# Patient Record
Sex: Female | Born: 1960 | Race: White | Hispanic: No | Marital: Married | Smoking: Never smoker
Health system: Southern US, Community
[De-identification: ages and names within clinical notes are randomized; demographics above are authoritative.]

## PROBLEM LIST (undated history)

## (undated) DIAGNOSIS — F419 Anxiety disorder, unspecified: Secondary | ICD-10-CM

## (undated) HISTORY — PX: ABDOMINAL HYSTERECTOMY: SHX81

## (undated) HISTORY — PX: TUBAL LIGATION: SHX77

---

## 2004-11-11 ENCOUNTER — Ambulatory Visit: Payer: Self-pay | Admitting: Internal Medicine

## 2005-07-08 ENCOUNTER — Ambulatory Visit: Payer: Self-pay | Admitting: Internal Medicine

## 2005-09-08 ENCOUNTER — Other Ambulatory Visit: Admission: RE | Admit: 2005-09-08 | Discharge: 2005-09-08 | Payer: Self-pay | Admitting: Obstetrics and Gynecology

## 2005-12-11 ENCOUNTER — Emergency Department (HOSPITAL_COMMUNITY): Admission: EM | Admit: 2005-12-11 | Discharge: 2005-12-11 | Payer: Self-pay | Admitting: Emergency Medicine

## 2006-01-06 ENCOUNTER — Encounter (INDEPENDENT_AMBULATORY_CARE_PROVIDER_SITE_OTHER): Payer: Self-pay | Admitting: Specialist

## 2006-01-06 ENCOUNTER — Ambulatory Visit (HOSPITAL_COMMUNITY): Admission: RE | Admit: 2006-01-06 | Discharge: 2006-01-06 | Payer: Self-pay | Admitting: Obstetrics and Gynecology

## 2006-03-16 ENCOUNTER — Inpatient Hospital Stay (HOSPITAL_COMMUNITY): Admission: RE | Admit: 2006-03-16 | Discharge: 2006-03-17 | Payer: Self-pay | Admitting: Obstetrics and Gynecology

## 2006-03-16 ENCOUNTER — Encounter (INDEPENDENT_AMBULATORY_CARE_PROVIDER_SITE_OTHER): Payer: Self-pay | Admitting: Specialist

## 2007-02-01 ENCOUNTER — Ambulatory Visit: Payer: Self-pay | Admitting: Internal Medicine

## 2007-03-09 ENCOUNTER — Ambulatory Visit: Payer: Self-pay | Admitting: Internal Medicine

## 2007-03-09 LAB — CONVERTED CEMR LAB
CO2: 30 meq/L (ref 19–32)
Chloride: 106 meq/L (ref 96–112)
Creatinine, Ser: 0.7 mg/dL (ref 0.4–1.2)
GFR calc Af Amer: 116 mL/min
Glucose, Bld: 85 mg/dL (ref 70–99)
Potassium: 4 meq/L (ref 3.5–5.1)
Sodium: 141 meq/L (ref 135–145)
Total Bilirubin: 0.7 mg/dL (ref 0.3–1.2)
Total CHOL/HDL Ratio: 3.9
Total Protein: 7.3 g/dL (ref 6.0–8.3)
Triglycerides: 59 mg/dL (ref 0–149)
VLDL: 12 mg/dL (ref 0–40)

## 2008-11-03 ENCOUNTER — Telehealth: Payer: Self-pay | Admitting: Internal Medicine

## 2008-11-18 ENCOUNTER — Telehealth: Payer: Self-pay | Admitting: Internal Medicine

## 2009-09-18 ENCOUNTER — Ambulatory Visit: Payer: Self-pay | Admitting: Family Medicine

## 2009-09-18 DIAGNOSIS — J069 Acute upper respiratory infection, unspecified: Secondary | ICD-10-CM | POA: Insufficient documentation

## 2010-02-18 ENCOUNTER — Telehealth: Payer: Self-pay | Admitting: Internal Medicine

## 2010-03-25 ENCOUNTER — Ambulatory Visit: Payer: Self-pay | Admitting: Internal Medicine

## 2010-06-09 ENCOUNTER — Telehealth: Payer: Self-pay | Admitting: Internal Medicine

## 2010-06-10 ENCOUNTER — Ambulatory Visit: Payer: Self-pay | Admitting: Internal Medicine

## 2010-06-12 ENCOUNTER — Ambulatory Visit (HOSPITAL_COMMUNITY)
Admission: RE | Admit: 2010-06-12 | Discharge: 2010-06-12 | Payer: Self-pay | Source: Home / Self Care | Attending: Internal Medicine | Admitting: Internal Medicine

## 2010-06-15 ENCOUNTER — Encounter: Payer: Self-pay | Admitting: Internal Medicine

## 2010-06-15 LAB — CONVERTED CEMR LAB
BUN: 11 mg/dL (ref 6–23)
Direct LDL: 151.5 mg/dL
HDL: 47.8 mg/dL (ref >39.00)
Triglycerides: 85 mg/dL (ref 0.0–149.0)

## 2010-06-16 ENCOUNTER — Ambulatory Visit: Payer: Self-pay

## 2010-06-16 ENCOUNTER — Encounter: Payer: Self-pay | Admitting: Internal Medicine

## 2010-06-21 ENCOUNTER — Encounter: Payer: Self-pay | Admitting: Internal Medicine

## 2010-07-20 NOTE — Assessment & Plan Note (Signed)
Summary: SINUS & COUGH/KH   Vital Signs:  Patient Profile:   50 Years Old Female CC:      sinus pressure, sore throat, chest congestion, cough, headache x 5 days Height:     62 inches Weight:      190 pounds O2 Sat:      95 % O2 treatment:    Room Air Temp:     97.0 degrees F oral Pulse rate:   107 / minute Pulse rhythm:   regular Resp:     12 per minute BP sitting:   118 / 81  (right arm) Cuff size:   regular  Vitals Entered By: Emilio Math (September 18, 2009 10:38 AM)                  Current Allergies: ! MACRODANTINHistory of Present Illness Chief Complaint: sinus pressure, sore throat, chest congestion, cough, headache x 5 days History of Present Illness: Subjective: Patient complains of URI symptoms that started 4 days ago.  Just now develping a cough. Initial sore throat now minimal No pleuritic pain, but has soreness over sternum No wheezing + nasal congestion ? post-nasal drainage No sinus pain/pressure No itchy/red eyes No earache No hemoptysis No SOB No fever, + chills last night No nausea No vomiting No abdominal pain No diarrhea No skin rashes + fatigue + myalgias + headache Used OTC meds without relief   Current Meds PREMARIN 0.3 MG TABS (ESTROGENS CONJUGATED)  BENZONATATE 200 MG CAPS (BENZONATATE) One by mouth hs as needed cough AZITHROMYCIN 250 MG TABS (AZITHROMYCIN) Two tabs by mouth on day 1, then 1 tab daily on days 2 through 5.  Rx void after 08/27/09  REVIEW OF SYSTEMS Constitutional Symptoms      Denies fever, chills, night sweats, weight loss, weight gain, and fatigue.  Eyes       Denies change in vision, eye pain, eye discharge, glasses, contact lenses, and eye surgery. Ear/Nose/Throat/Mouth       Complains of sinus problems, sore throat, and hoarseness.      Denies hearing loss/aids, change in hearing, ear pain, ear discharge, dizziness, frequent runny nose, frequent nose bleeds, and tooth pain or bleeding.  Respiratory  Complains of productive cough and shortness of breath.      Denies dry cough, wheezing, asthma, bronchitis, and emphysema/COPD.  Cardiovascular       Denies murmurs, chest pain, and tires easily with exhertion.    Gastrointestinal       Denies stomach pain, nausea/vomiting, diarrhea, constipation, blood in bowel movements, and indigestion. Genitourniary       Denies painful urination, kidney stones, and loss of urinary control. Neurological       Denies paralysis, seizures, and fainting/blackouts. Musculoskeletal       Denies muscle pain, joint pain, joint stiffness, decreased range of motion, redness, swelling, muscle weakness, and gout.  Skin       Denies bruising, unusual mles/lumps or sores, and hair/skin or nail changes.  Psych       Denies mood changes, temper/anger issues, anxiety/stress, speech problems, depression, and sleep problems. 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  Extraocular movement is intact.  Conjunctivae are not inflamed.  Ears:  Canals normal.  Tympanic membranes normal.   Nose:  Normal septum.  Normal turbinates, mildly congested.  No sinus tenderness present.  Pharynx:  Normal  Neck:  Supple.  No adenopathy is present.  Lungs:  Clear to auscultation.  Breath sounds are equal.  Chest:  tenderness over sternum Heart:  Regular rate and rhythm without murmurs, rubs, or gallops.  Abdomen:  Nontender without masses or hepatosplenomegaly.  Bowel sounds are present.  No CVA or flank tenderness.  Assessment New Problems: URI (ICD-465.9)  SUSPECT VIRAL URI; NO EVIDENCE BACTERIAL INFECTION AT THIS TIME  Plan New  Medications/Changes: AZITHROMYCIN 250 MG TABS (AZITHROMYCIN) Two tabs by mouth on day 1, then 1 tab daily on days 2 through 5.  Rx void after 08/27/09  #6 tabs x 0, 09/18/2009, Donna Christen MD BENZONATATE 200 MG CAPS (BENZONATATE) One by mouth hs as needed cough  #12 x 0, 09/18/2009, Donna Christen MD  New Orders: New Patient Level III [phone] Planning Comments:   Treat symptomatically for now:  expectorant/decongestant, fluids, topical decongestant, cough suppressant at night. Rx for Z-pack to hold; begin taking  if develops increasing facial pressure and/or fever Follow-up with PCP if not improving    Diagnoses and expected course of recovery discussed and will return if not improved as expected or if the condition worsens. Patient and/or caregiver verbalized understanding.  Prescriptions: AZITHROMYCIN 250 MG TABS (AZITHROMYCIN) Two tabs by mouth on day 1, then 1 tab daily on days 2 through 5.  Rx void after 08/27/09  #6 tabs x 0   Entered and Authorized by:   Donna Christen MD   Signed by:   Donna Christen MD on 09/18/2009   Method used:   Print then Give to Patient   RxID:   [id number] BENZONATATE 200 MG CAPS (BENZONATATE) One by mouth hs as needed cough  #12 x 0   Entered and Authorized by:   Donna Christen MD   Signed by:   Donna Christen MD on 09/18/2009   Method used:   Print then Give to Patient   RxID:   [id number]   Patient Instructions: 1)  May use Mucinex D (guaifenesin with decongestant) twice daily for congestion (or may use Mucinex plain plus Sudafed) 2)  Increase fluid intake, rest. 3)  May use Afrin nasal spray (or generic oxymetazoline) twice daily for about 5 days.  Also recommend using saline nasal spray several times daily and/or saline nasal irrigation. 4)  If persistent fever and facial pain develop, begin azithromycin. 5)  Followup with family doctor if not improving 7 to 10 days.

## 2010-07-20 NOTE — Progress Notes (Signed)
Summary: Sore throat  Phone Note Outgoing Call Call back at Work Phone [phone] Call back at [phone] (Cell - ok detailed vm)   Call placed by: Ami Bullins CMA,  February 18, 2010 8:42 AM Call placed to: Patient Details for Reason: returning pt's call Summary of Call: Pt called with c/o sore throat and sinus congestion x 4 days. I have called pt and lmovm for her to return my call Initial call taken by: Ami Bullins CMA,  February 18, 2010 8:43 AM  Follow-up for Phone Call        Pt c/o "very" sore throat, sinus congestion & drainage since monday. She has tried tylenol and ibuprofen w/little relief. Pt has mucinex D and wants to know if this is ok or other otc suggestions. Please advise.   No fever or cough. Follow-up by: Lamar Sprinkles, CMA,  February 18, 2010 8:58 AM  Additional Follow-up for Phone Call Additional follow up Details #1::        recommend ov to evaluate persistent sore throat Additional Follow-up by: Jacques Navy MD,  February 18, 2010 3:15 PM    Additional Follow-up for Phone Call Additional follow up Details #2::    Left message on voicemail notifying patient. Follow-up by: Lucious Groves CMA,  February 18, 2010 3:45 PM

## 2010-07-20 NOTE — Assessment & Plan Note (Signed)
Summary: CONGESTION/ LOV 2008 /NWS   Vital Signs:  Patient profile:   50 year old female Height:      62 inches Weight:      202 pounds BMI:     37.08 O2 Sat:      98 % on Room air Temp:     97.2 degrees F oral Pulse rate:   83 / minute BP sitting:   122 / 84  (left arm) Cuff size:   regular  Vitals Entered By: Bill Salinas CMA (March 25, 2010 8:58 AM)  O2 Flow:  Room air CC: pt c/o sinus pressure, productive cough (productive green mucous) x 3 days. Pt states she started on mucinex yesterday/ ab   CC:  pt c/o sinus pressure and productive cough (productive green mucous) x 3 days. Pt states she started on mucinex yesterday/ ab.  History of Present Illness: [name] is a 50 y.o. caucasian female who presents with pressure like symptoms in her sinuses that have lasted for two days. She complains of green sputum in her cough and nasal discharges. She noticed onset of symptoms on the night of 04OCT2011. The following day she took mucinex which releived the symptoms temporarily. To help herself sleep at night [name] took [employer] and Benadryl which allowed her to sleep. She denies fever, night sweats, discharge from her eyes. She admits to experiencing DOE, slight pressure-like chest pain, and slight light-headedness. Before presenting to the clinic, [name] took an advil.  Current Medications (verified): 1)  Premarin 0.3 Mg Tabs (Estrogens Conjugated)  Allergies (verified): 1)  ! Macrodantin  Past History:  Past Medical History: Reviewed history from 09/18/2009 and no changes required. Unremarkable  Past Surgical History: Reviewed history from 09/18/2009 and no changes required. Caesarean section Hysterectomy Tubal ligation  Review of Systems       The patient complains of chest pain, dyspnea on exertion, and prolonged cough.  The patient denies fever, weight loss, weight gain, vision loss, syncope, abdominal pain, melena, severe indigestion/heartburn, muscle  weakness, and suspicious skin lesions.         Slight pressure-like pain Patient finds it harded to breath with her cold like symptoms Cough has lasted for three days and is productive with green sputum  Physical Exam  General:  alert, appropriate dress, normal appearance, cooperative to examination, good hygiene, and overweight-appearing.   Eyes:  vision grossly intact, pupils equal, pupils round, pupils reactive to light, no injection, and no iris abnormalities.   Nose:  no external deformity, mucosal edema, airflow obstruction, L frontal sinus tenderness, L maxillary sinus tenderness, R frontal sinus tenderness, and R maxillary sinus tenderness.  poor transillumination of the frontal and maxillary sinuses Mouth:  postnasal drip.   Lungs:  normal respiratory effort, normal breath sounds, no crackles, and no wheezes.   Heart:  normal rate and regular rhythm.   Skin:  turgor normal and color normal.   Psych:  normally interactive and good eye contact.     Impression & Recommendations:  Problem # 1:  SINUSITIS (ICD-473.9) Patient's lower turbinates and septum were inflamed and irritated. She presented with tenderness in both frontal and both maxillary sinuses, with opacification of her right maxillary sinus.   Plan-            Azithromycin 250 Mg Tabs (Azithromycin) .Marland Kitchen..Marland Kitchen Two tabs by mouth on day 1, then 1 tab daily on days 2 through 5.  Pseudoephedrine 2-3 tablets once daily            Nasal Saline Spray            Antiobiotic Ointment for irritant in nasal passage  The following medications were removed from the medication list:    Benzonatate 200 Mg Caps (Benzonatate) ..... One by mouth hs as needed cough    Azithromycin 250 Mg Tabs (Azithromycin) .Marland Kitchen..Marland Kitchen Two tabs by mouth on day 1, then 1 tab daily on days 2 through 5.  rx void after 08/27/09  Complete Medication List: 1)  Premarin 0.3 Mg Tabs (Estrogens conjugated)

## 2010-07-22 NOTE — Progress Notes (Signed)
Summary: OV TOMORROW AM   Phone Note Call from Patient   Summary of Call: Pt left vm at 4:55 today. Spoke w/pt, she was in a meeting at wk and had 10 min episode. She had trouble getting the words she wanted to say out, could not write a legible sentence and had some disburbance in the right side of her right eye. She did not try to walk and stopped trying to speak due to the fact she was in a meeting w/managers. This lasted approx 10 min. At this time - approx 1 hour after episode - she has no complaints. Vision, speech, thought etc is all normal. Advised she go to ER or call on call nurses if symptoms recurred or had new symptoms, pt agreed. Scheduled for office visit tomorrow am at 9.  Initial call taken by: Lamar Sprinkles, CMA,  June 09, 2010 6:05 PM

## 2010-07-22 NOTE — Letter (Signed)
   [facility] [address] [place], [place]  [address] Phone: [phone]      June 22, 2010   [facility] [address] [place], [place] [address]  RE:  LAB RESULTS  Dear  Ms. [facility],  The following is an interpretation of your most recent lab tests.  Please take note of any instructions provided or changes to medications that have resulted from your lab work.  Carotid doppler exam was normal.  Please come see me if you have any questions about these lab results    Sincerely Yours,    Jacques Navy MD

## 2010-07-22 NOTE — Miscellaneous (Signed)
Summary: Orders Update  Clinical Lists Changes  Orders: Added new Test order of Carotid Duplex (Carotid Duplex) - Signed 

## 2010-07-22 NOTE — Assessment & Plan Note (Signed)
Summary: episode of vision & speech trouble/SD   Vital Signs:  Patient profile:   50 year old female Height:      62 inches Weight:      200 pounds BMI:     36.71 O2 Sat:      97 % on Room air Temp:     98.3 degrees F oral Pulse rate:   73 / minute BP sitting:   120 / 88  (left arm) Cuff size:   regular  Vitals Entered By: Bill Salinas CMA (June 10, 2010 9:13 AM)  O2 Flow:  Room air CC: pt here for evaluation after she had an episode yesterday, which she explains as she was trying to talk but could not get her words out. Shortly after she developed a headache. She also has c/o trouble sleeping at night with snoring and SOB/ ab   [facility]:  Jacques Navy MD  CC:  pt here for evaluation after she had an episode yesterday and which she explains as she was trying to talk but could not get her words out. Shortly after she developed a headache. She also has c/o trouble sleeping at night with snoring and SOB/ ab.  History of Present Illness: Patient presents for evaluation of episode of change in mental status. She reports that while at a mtg yesterday PM she had a 10 minute episode when she experienced an expressive aphasia, could not write an intelligible sentence. She experienced no paralysis, no loss of vision, no focal weakness or paresthesia. She never lost consciousness. She made a full recovery in approximately 10 minutes but did feel fatigued afterward. She has exprience no further symptoms. Her risk factors for atherosclerotic vascular disease are minimal: she is overweight. She does not smoke, no HTN, no increase lipids, not diabetic.   Current Medications (verified): 1)  Premarin 0.3 Mg Tabs (Estrogens Conjugated) 2)  Fish Oil 1000 Mg Caps (Omega-3 Fatty Acids) .Marland Kitchen.. 1 Capsule Once A Day 3)  Pro-Flora Concentrate  Caps (Probiotic Product) .Marland Kitchen.. 1 Capsule Daily 4)  One-A-Day Extras Antioxidant  Caps (Multiple Vitamins-Minerals) .Marland Kitchen.. 1 Capsule Daily  Allergies  (verified): 1)  ! 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    Physical Exam  General:  Well-developed,well-nourished,in no acute distress; alert,appropriate and cooperative throughout examination Head:  normocephalic and atraumatic.   Eyes:  vision grossly intact, pupils equal, pupils round, pupils reactive to light, pupils react to accomodation, corneas and lenses clear, and no retinal abnormalitiies.   Ears:  External ear exam shows no significant lesions or deformities.  Otoscopic examination reveals clear canals, tympanic membranes are intact bilaterally without bulging, retraction, inflammation or discharge. Hearing is grossly normal bilaterally. Neck:  supple, full ROM, no masses, and no carotid bruits.   Chest Wall:  no deformities.   Lungs:  normal respiratory effort and normal breath sounds.   Heart:  normal rate, regular rhythm, and no murmur.   Pulses:  2+ radial Extremities:  No clubbing, cyanosis, edema, or deformity noted with normal full  range of motion of all joints.   Neurologic:  alert & oriented X3, cranial nerves II-XII intact, strength normal in all extremities, sensation intact to light touch, gait normal, DTRs symmetrical and normal, and finger-to-nose normal. Nl rapid alternating finger movement, no dysdiadochokinesia.  Handwriting -  excellent. Skin:  turgor normal and color normal.   Cervical Nodes:  No lymphadenopathy noted Psych:  Oriented X3, memory intact for recent and remote, normally interactive, and good eye contact.     Impression & Recommendations:  Problem # 1:  ALTERED MENTAL STATUS (ICD-780.97) Patient with transient altered mental status. She has a low risk profile. Concern is for possible TIA.  Plan - routine lab; MRI/MRA brain; carotid dopplers.           start ASA 325 mg once daily   Orders: TLB-BMP (Basic Metabolic Panel-BMET) (80048-METABOL) TLB-Lipid Panel (80061-LIPID) Cardiology Referral (Cardiology) Radiology Referral (Radiology)  Complete Medication List: 1)  Premarin 0.3 Mg Tabs (Estrogens conjugated) 2)  Fish Oil 1000 Mg Caps (Omega-3 fatty acids) .Marland Kitchen.. 1 capsule once a day 3)  Pro-flora Concentrate Caps (Probiotic product) .Marland Kitchen.. 1 capsule daily 4)  One-a-day Extras Antioxidant Caps (Multiple vitamins-minerals) .Marland Kitchen.. 1 capsule daily   Orders Added: 1)  TLB-BMP (Basic Metabolic Panel-BMET) [80048-METABOL] 2)  TLB-Lipid Panel [80061-LIPID] 3)  Cardiology Referral [Cardiology] 4)  Radiology Referral [Radiology] 5)  Est. Patient Level IV [04540]

## 2010-08-25 ENCOUNTER — Other Ambulatory Visit: Payer: Self-pay | Admitting: Obstetrics and Gynecology

## 2010-08-25 DIAGNOSIS — N6310 Unspecified lump in the right breast, unspecified quadrant: Secondary | ICD-10-CM

## 2010-08-31 ENCOUNTER — Other Ambulatory Visit: Payer: Self-pay | Admitting: Obstetrics and Gynecology

## 2010-08-31 ENCOUNTER — Ambulatory Visit
Admission: RE | Admit: 2010-08-31 | Discharge: 2010-08-31 | Disposition: A | Discharge status: Home / Self Care | Payer: 59 | Source: Ambulatory Visit | Attending: Obstetrics and Gynecology | Admitting: Obstetrics and Gynecology

## 2010-08-31 DIAGNOSIS — N6310 Unspecified lump in the right breast, unspecified quadrant: Secondary | ICD-10-CM

## 2010-11-02 NOTE — Assessment & Plan Note (Signed)
[facility]                           PRIMARY CARE OFFICE NOTE   [name], [name]                   MRN:          [id number]  DATE:03/09/2007                            DOB:          [birth year]    Ms. [name] was seen in the office February 01, 2007, and at that time was  diagnosed with probable anxiety with mild depressive overtones.  She was  started on sertraline 50 mg daily.  She returns for followup and reports  she has had good results.  She finds herself much less emotionally  labile which she finds refreshing.  She reports she has had a lifetime  history of being brought to tears easily and being very emotionally  labile.  She has had no side effects from medication.   PAST MEDICAL HISTORY/FAMILY HISTORY/SOCIAL HISTORY:  Well documented in  Dr. Raphael Gibney note of Nov 11, 2004.  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   CURRENT MEDICATIONS:  1. Metamucil daily.  2. Premarin 0.3 mg daily.  3. Sertraline 50 mg daily.  4. Milk of Magnesia p.r.n.   REVIEW OF SYSTEMS:  Negative for any constitutional, cardiovascular,  respiratory, GI, GU or musculoskeletal complaints.   PHYSICAL EXAMINATION:  Temperature was 97.9, blood pressure 105/73,  pulse 72, weight 176.  GENERAL APPEARANCE:  A heavyset Caucasian female,  looks her stated age,  in no acute distress.  HEENT EXAM:  Normocephalic, atraumatic.  EACs with dry cerumen but TMs  were visualized.  No inflammation was noted.  Oropharynx with native  dentition in good repair.  No buccal or palate lesions were noted.  Posterior pharynx was clear.  Conjunctivae and sclerae were clear.  Pupils are equal, round, and reactive to light and accommodation.  Funduscopic exam deferred to ophthalmology.  NECK:  Supple without thyromegaly.  NODES:  No  lymphadenopathy was noted in the cervical or supraclavicular  regions.  CHEST:  No CVA tenderness.  LUNGS:  Clear to auscultation and percussion.  BREAST EXAM:  Deferred to Dr. Henderson Cloud.  CARDIOVASCULAR:  2+ radial pulses, no JVD or carotid bruits.  She had a  quiet precordium with a regular rate and rhythm without murmurs, rubs,  or gallops.  ABDOMEN:  Soft, no guarding, no rebound.  No organosplenomegaly was  noted.  PELVIC EXAM:  Deferred to Dr. Henderson Cloud.  RECTAL EXAM:  Deferred to Dr. Henderson Cloud.  EXTREMITIES:  Without cyanosis, clubbing, edema, no deformities were  noted.   No laboratories were obtained for this visit.  The patient's last  laboratory was from July 08, 2005 and this revealed a normal CBC.  The chemistries were all normal, including a normal glucose of 97.  Last  lipid profile dates from June of 2006, and this revealed a cholesterol  of 157, triglycerides 73, HDL 44, and LDL was 98.  The patient was  informed that she would not need additional screening until 2011.   In summary, this is a very pleasant woman who  seems to be medically  stable.  Her anxiety disorder is responding nicely to Zoloft, and she  will continue the same.  She is current and up to date with her  gynecologist for health maintenance.  She is asked to return to see me  on a p.r.n. basis.     Laura Gess Norins, MD  Electronically Signed    MEN/MedQ  DD: 03/10/2007  DT: 03/10/2007  Job #: [id number]

## 2010-11-05 NOTE — Op Note (Signed)
NAMECLAUDELL, [name]            ACCOUNT NO.:  [id number]   MEDICAL RECORD NO.:  [id number]          PATIENT TYPE:  INP   LOCATION:  9307                          FACILITY:  [facility]   PHYSICIAN:  Carrington Clamp, M.D. DATE OF BIRTH:  1960-12-24   DATE OF PROCEDURE:  03/16/2006  DATE OF DISCHARGE:                                 OPERATIVE REPORT   PREOPERATIVE DIAGNOSIS:  Menorrhagia unresponsive to conservative treatment.   POSTOPERATIVE DIAGNOSES:  1. Menorrhagia unresponsive to conservative treatment.  2. Endometriosis and fibroids.   PROCEDURE:  Total abdominal hysterectomy with bilateral salpingo-  oophorectomy.   SURGEON:  Dr. Henderson Cloud   ASSISTANT:  Dr. Greta Doom   ANESTHESIA:  General.   SPECIMENS:  Uterus and cervix.   ESTIMATED BLOOD LOSS:  100 mL.   IV FLUIDS:  1500 mL.   URINE OUTPUT:  200 mL clear.   COMPLICATIONS:  None.   FINDINGS:  There were multiple sites of endometriosis on the uterus and the  broad ligaments.  These areas were able to be excised with the uterine  specimen.  There was also a plaque of endometriosis in the cul-de-sac on the  reflection of the posterior vagina.  This is able to be cauterized with  Bovie cautery.  Ovaries and tubes appeared normal.  The uterus was  approximately 10-12 weeks in size with multiple small centimeter to 2 cm  fibroids.  Medications were Gelfoam and Ancef.  Counts were correct x3.   TECHNIQUE:  After adequate general anesthesia was achieved, the patient  prepped, draped in usual sterile fashion in dorsal supine position.  Pfannenstiel skin incision was made with the scalpel, carried down to the  fascia with the Bovie cautery.  The fascia was incised in the midline with  the Bovie cautery and then continued transverse curvilinear manner with the  Mayo scissors.  The fascia was reflected superiorly and inferiorly from the  rectus muscles and the rectus muscles split in the midline.  A bowel free  portion of  peritoneum was entered into sharply with the Metzenbaum scissors  with good visualization of the bowel and the bladder.  The peritoneum was  then incised in a superior and inferior manner with the Metzenbaums with  good visualization of bowel, bladder, and bowel.   The bowel was packed away with wet laps and O'Connor-O'Sullivan retractor  was placed.  The uterus was grasped with a pair of Kelly clamps.  The round  ligaments were suture transfixed with a stitch of 0 Vicryl.  Each pedicle  was divided with the Bovie cautery, and the Bovie cautery was then used to  incise the vesicouterine fascia.  Sharp and blunt dissection was used to  reflect the vesicouterine fascia off the cervix, thus retracting the bladder  inferiorly.  An avascular portion in the posterior broad ligament was then  entered into bilaterally with the Bovie cautery.  Each infundibulopelvic  ligament was secured with a Heaney and then divided with the Mayo scissors  and secured with both a freehand tie and a stitch of 0 Vicryl.  The ureters  were able to be identified  coursing well out of the field of dissection.  The uterine arteries were then skeletonized, and the uterine artery was  clamped bilaterally at the level of the internal os with a pair of Heaneys.  Each pedicle was divided with the Mayo scissors and secured with a stitch of  0 Vicryl.   Alternating successive bites of the bowel of the straight Heaney clamp were  used to divide the cardinal ligament.  Each pedicle was incised with the  scalpel and then secured with a stitch of 0 Vicryl.  At the level of the  reflection of the vagina onto the cervix route, a curved Heaney was placed.  The scalpel was used to incise this area, and the vagina had been entered  into.  The angle of the cuff and the uterosacral had been incorporated on  the left-hand side with Heaney stitch of 0 Vicryl.  On the left-hand side,  the uterosacral was secured with a stitch of 0 Vicryl.   The Jorgenson  scissors were then used to amputate the uterine specimen and the cervix, and  that was handed to pathology.  The angle of the cuff on the left-hand side  was secured with a 0 Vicryl Heaney stitch.   Four figure-of-eight stitches were used to close the vaginal cuff with 0  Vicryl.  A couple additional figure-of-eight stitches of 2-0 Vicryl on SH  needle were then used to ensure hemostasis on the posterior cuff and the  anterior cuff.  The ureters were identified before and after this and  coursing out of the field of operation.  Irrigation was performed, and the  small plaque of endometriosis left in the cul-de-sac on the posterior vagina  was in between the uterosacrals.  It was then cauterized with Bovie cautery.  A piece of Gelfoam was placed over this area secondary to some small oozing.  All instruments were then withdrawn from the abdomen once hemostasis was  achieved.   A freehand tie stitch had been placed on the large vessel in the peritoneum  close to the bladder but not inside the bladder.  The peritoneum was then  closed with a running stitch of 2-0 Vicryl.  This incorporated the rectus  muscles as a separate layer.  The fascia was then closed with a running  stitch of 0 PDS.  The subcutaneous tissue was rendered hemostatic with Bovie  cautery and irrigation.  This was then closed with interrupted stitches of 2-  0 plain gut.  The skin was closed with staples.  The patient tolerated the  procedure well, was returned to the recovery room in stable condition.      Carrington Clamp, M.D.  Electronically Signed     MH/MEDQ  D:  03/16/2006  T:  03/17/2006  Job:  [id number]

## 2010-11-05 NOTE — Op Note (Signed)
[name], HOGANSON            ACCOUNT NO.:  [id number]   MEDICAL RECORD NO.:  [id number]          PATIENT TYPE:  AMB   LOCATION:  SDC                           [facility]ILITY:  [facility]   PHYSICIAN:  Carrington Clamp, M.D. DATE OF BIRTH:  22-Feb-1961   DATE OF PROCEDURE:  01/06/2006  DATE OF DISCHARGE:                                 OPERATIVE REPORT   PREOPERATIVE DIAGNOSIS:  Menorrhagia.   POSTOPERATIVE DIAGNOSIS:  Menorrhagia.   PROCEDURE:  Dilation and curettage with hysteroscopy.  The Novasure ablation  was abandoned.   SURGEON:  Dr. Henderson Cloud.   ASSISTANTS:  None.   ANESTHESIA:  LMA general.   SPECIMENS:  Uterine curetting.   ESTIMATED BLOOD LOSS:  35 mL.   IV FLUIDS:  1000 mL.   URINE OUTPUT:  Not measured.   HYSTEROSCOPY LOSS:  250 mL.   COMPLICATIONS:  A posterior fundal perforation.   FINDINGS:  It was difficult to see cavity because of excessive amount of  bleeding on entry.  I believe this bleeding was from the endometrium and not  from perforation.  There were endometrial curettings taken but there were no  polyps or fibroids seen.  The perforation appeared to be posterior fundal  tracking through the portion of the cervix.  This was stable without an  excessive amount of bleeding.  It is probable that the perforation happened  during the curettage and not during the hysteroscopy.   MEDICATIONS:  1% Xylocaine.   COUNTS:  Correct x3.   TECHNIQUE:  After adequate general anesthesia was achieved, the patient was  prepped and draped in usual sterile fashion in dorsal lithotomy position.  The bladder was emptied with a red rubber catheter and a speculum placed in  the vagina.  Single-tooth tenaculum was used to grasp the cervix and the  cervix was dilated with Shawnie Pons dilators.  The cavity measured 10 cm in length  and the cervix measured 4 cm in length by a Hegar dilator.  The hysteroscope  was then passed into the uterine cavity.  Bimanual exam had been done  just  prior to the procedure and was indicated that the uterus was anteverted but  not excessively so.  There was an excessive amount of blood in the cavity  and the hysteroscopy was difficult because of the amount of tissue and blood  that was in there making it difficult to see. It is possible the perforation  had happened prior to this point, but when the perforation was looked at  later through the scope  it was  clearly posterior and the bleeding at this  point was coming from inside the cavity itself.   The hysteroscopy was attempted a couple times with increase in the pressure  flow and was unable to identify the ostia to indicate the cavity.  The  hysteroscope was then withdrawn and a curettage was then performed.  At one  point, the curet was able to be pushed into the uterus further than what was  possible for the size of the uterus and the hysteroscope was replaced.  Hysteroscope was able to track the perforation  posteriorly but not to be  able to see into the abdominal cavity.  At this point, the deficit went from  80 mL of hysteroscopic fluid to 250 mL of hysteroscopic fluid and procedure  was then abandoned including the NovaSure procedure.  All instruments were  then withdrawn from the vagina after the cervix was injected with 1%  Xylocaine.  Bleeding was watched from the os and was scant to none.  The  patient tolerated the procedure well and was returned to recovery room in  stable condition.      Carrington Clamp, M.D.  Electronically Signed     MH/MEDQ  D:  01/06/2006  T:  01/06/2006  Job:  [id number]

## 2010-11-05 NOTE — Discharge Summary (Signed)
NAMENICHELE, SLAWSON            ACCOUNT NO.:  [id number]   MEDICAL RECORD NO.:  [id number]          PATIENT TYPE:  INP   LOCATION:  9307                          [facility]ILITY:  [facility]   PHYSICIAN:  Carrington Clamp, M.D. DATE OF BIRTH:  11-20-1960   DATE OF ADMISSION:  03/16/2006  DATE OF DISCHARGE:  03/17/2006                               DISCHARGE SUMMARY   ADMITTING DIAGNOSIS:  Menorrhagia unresponsive to D&C, unable to do  NovaSure.   DISCHARGE DIAGNOSIS:  Menorrhagia unresponsive to D&C, unable to do  NovaSure, plus endometriosis and fibroids.   PERTINENT PROCEDURES PERFORMED:  Total abdominal hysterectomy with  bilateral salpingo-oophorectomy.   PERTINENT TEST RESULTS:  Were a postoperative H&H of 11.9 and 34.8.   HISTORY AND PHYSICAL:  A 50 year old with menorrhagia unresponsive to  dilation and curettage.  I was unable to do NovaSure ablation secondary  to retroverted uterus and a perforation at the time.  The patient  strongly desired definitive therapy.  The patient had been having hot  flashes and desired bilateral salpingo-oophorectomy.   PAST MEDICAL HISTORY:  Blood pressure had previously dropped at cesarean  section but no other medical problems.   PAST SURGICAL HISTORY:  The dilation and curettage, bilateral tubal  ligation, cesarean section.   PAST OBSTETRICAL HISTORY:  Term cesarean section.   PAST GYNECOLOGICAL HISTORY:  Negative for pelvic inflammatory disease or  abnormal Pap smears.   ALLERGIES:  TO MACRODANTIN.   MEDICATIONS:  Ibuprofen.   PHYSICAL EXAMINATION:  Blood pressure 118/81.  She is well.  HEENT:  Is icteric.  NECK:  Is without thyromegaly.  LUNGS:  Clear to auscultation bilaterally.  HEART:  Regular rate and rhythm.  BREASTS:  No masses.  ABDOMEN:  Soft, nontender, nondistended.  PELVIC EXAM:  Revealed normal external genitalia, vagina and cervix.  Uterus was about 10 weeks size with a normal size, shape and mobile.  RECTUM:  Was  benign.  EXTREMITIES AND SKIN:  Were benign.   Ultrasound revealed a 10 x 5 x 5.3-cm uterus with 2-cm fibroid.  The  ovaries appeared normal except for small cysts.   ASSESSMENT:  This is a 50 year old with perimenopausal symptoms desired  for definitive therapy for menorrhagia.  She had been unresponsive to  conservative therapy.  The patient desired to undergo a total abdominal  hysterectomy with bilateral salpingo-oophorectomy.  All risks, benefits  and alternatives were discussed with the patient.  The patient was to  receive SCDs intraoperatively and preoperative antibiotics.   The patient underwent the above-named procedures on March 16, 2006  without complication.  Findings were multiple sites of endometriosis in  the broad ligaments and the cul-de-sac and on the ovaries.  The patient  was returned to recovery room in stable condition.  Postoperative day #1  in the evening, the patient eating, ambulating, voiding without  complication, and the patient desired to go home.  So, she was  discharged with the following.   MEDICATIONS:  Percocet 1 at 5 mg 1 p.o. q.4-6h. p.r.n. pain.   ACTIVITIES:  Walk with assistance.  No driving for 2 weeks.  No lifting  for 6 weeks.  No sexual activity for 6 weeks.  May shower but no bath.  May walk up steps.   The patient was to return to Dr. Henderson Cloud the following Tuesday to remove  the staples and to call for any excessive vaginal bleeding or  uncontrolled pain.      Carrington Clamp, M.D.  Electronically Signed     MH/MEDQ  D:  04/12/2006  T:  04/13/2006  Job:  [id number]

## 2010-11-30 ENCOUNTER — Other Ambulatory Visit: Payer: Self-pay | Admitting: Internal Medicine

## 2010-11-30 ENCOUNTER — Other Ambulatory Visit: Payer: Self-pay

## 2010-11-30 DIAGNOSIS — E785 Hyperlipidemia, unspecified: Secondary | ICD-10-CM

## 2011-03-25 ENCOUNTER — Encounter: Payer: Self-pay | Admitting: Family Medicine

## 2011-03-25 ENCOUNTER — Inpatient Hospital Stay (INDEPENDENT_AMBULATORY_CARE_PROVIDER_SITE_OTHER)
Admission: RE | Admit: 2011-03-25 | Discharge: 2011-03-25 | Disposition: A | Discharge status: Home / Self Care | Payer: 59 | Source: Ambulatory Visit | Attending: Family Medicine | Admitting: Family Medicine

## 2011-03-25 DIAGNOSIS — R259 Unspecified abnormal involuntary movements: Secondary | ICD-10-CM | POA: Insufficient documentation

## 2011-03-25 DIAGNOSIS — J069 Acute upper respiratory infection, unspecified: Secondary | ICD-10-CM

## 2011-03-25 DIAGNOSIS — J40 Bronchitis, not specified as acute or chronic: Secondary | ICD-10-CM

## 2011-03-28 ENCOUNTER — Telehealth (INDEPENDENT_AMBULATORY_CARE_PROVIDER_SITE_OTHER): Payer: Self-pay | Admitting: Emergency Medicine

## 2011-04-06 ENCOUNTER — Other Ambulatory Visit (INDEPENDENT_AMBULATORY_CARE_PROVIDER_SITE_OTHER): Payer: 59

## 2011-04-06 ENCOUNTER — Other Ambulatory Visit: Payer: Self-pay | Admitting: Internal Medicine

## 2011-04-06 DIAGNOSIS — E785 Hyperlipidemia, unspecified: Secondary | ICD-10-CM

## 2011-04-06 LAB — LIPID PANEL
HDL: 59.8 mg/dL (ref >39.00)
Total CHOL/HDL Ratio: 4
Triglycerides: 95 mg/dL (ref 0.0–149.0)
VLDL: 19 mg/dL (ref 0.0–40.0)

## 2011-04-06 LAB — LDL CHOLESTEROL, DIRECT: Direct LDL: 147.1 mg/dL

## 2011-04-10 ENCOUNTER — Encounter: Payer: Self-pay | Admitting: Internal Medicine

## 2011-05-23 NOTE — Progress Notes (Signed)
Summary: COUGH,CHEST CONGESTION...WSE (room 5)   Vital Signs:  Patient Profile:   50 Years Old Female CC:      cough, chest congestion x  Height:     62 inches Weight:      197 pounds O2 Sat:      97 % O2 treatment:    Room Air Temp:     98.3 degrees F oral Pulse rate:   70 / minute Resp:     16 per minute BP sitting:   127 / 82  (left arm) Cuff size:   regular  Vitals Entered By: Clemens Catholic LPN (March 25, 2011 6:36 PM)                  Prior Medication List:  PREMARIN 0.3 MG TABS (ESTROGENS CONJUGATED)  FISH OIL 1000 MG CAPS (OMEGA-3 FATTY ACIDS) 1 capsule once a day PRO-FLORA CONCENTRATE  CAPS (PROBIOTIC PRODUCT) 1 capsule daily ONE-A-DAY EXTRAS ANTIOXIDANT  CAPS (MULTIPLE VITAMINS-MINERALS) 1 capsule daily   Updated Prior Medication List: PREMARIN 0.3 MG TABS (ESTROGENS CONJUGATED)  FISH OIL 1000 MG CAPS (OMEGA-3 FATTY ACIDS) 1 capsule once a day PRO-FLORA CONCENTRATE  CAPS (PROBIOTIC PRODUCT) 1 capsule daily ONE-A-DAY EXTRAS ANTIOXIDANT  CAPS (MULTIPLE VITAMINS-MINERALS) 1 capsule daily ASPIRIN 325 MG TABS (ASPIRIN)   Current Allergies (reviewed today): ! MACRODANTINHistory of Present Illness Chief Complaint: cough, chest congestion x  History of Present Illness: Cough and congestion for about 1 week. She states it feellsas if csomething wants to come upp but most ofthe time it will not.  Current Problems: TWITCHING (ICD-781.0) TRACHEOBRONCHITIS (ICD-490) URI (ICD-465.9)   Current Meds PREMARIN 0.3 MG TABS (ESTROGENS CONJUGATED)  FISH OIL 1000 MG CAPS (OMEGA-3 FATTY ACIDS) 1 capsule once a day PRO-FLORA CONCENTRATE  CAPS (PROBIOTIC PRODUCT) 1 capsule daily ONE-A-DAY EXTRAS ANTIOXIDANT  CAPS (MULTIPLE VITAMINS-MINERALS) 1 capsule daily ASPIRIN 325 MG TABS (ASPIRIN)  ZITHROMAX Z-PAK 250 MG  TABS (AZITHROMYCIN) Use as directed TUSSIONEX PENNKINETIC ER 8-10 MG/5ML LQCR (CHLORPHENIRAMINE-HYDROCODONE) 1/2 to 1 tsp by mouth twice a day  REVIEW OF  SYSTEMS Constitutional Symptoms      Denies fever, chills, night sweats, weight loss, weight gain, and fatigue.  Eyes       Denies change in vision, eye pain, eye discharge, glasses, contact lenses, and eye surgery.      Comments: increase twitching of her eyes Ear/Nose/Throat/Mouth       Complains of hoarseness.      Denies hearing loss/aids, change in hearing, ear pain, ear discharge, dizziness, frequent runny nose, frequent nose bleeds, sinus problems, sore throat, and tooth pain or bleeding.  Respiratory       Complains of dry cough.      Denies productive cough, wheezing, shortness of breath, asthma, bronchitis, and emphysema/COPD.  Cardiovascular       Denies murmurs, chest pain, and tires easily with exhertion.    Gastrointestinal       Denies stomach pain, nausea/vomiting, diarrhea, constipation, blood in bowel movements, and indigestion. Genitourniary       Denies painful urination, kidney stones, and loss of urinary control. Neurological       Denies paralysis, seizures, and fainting/blackouts. Musculoskeletal       Denies muscle pain, joint pain, joint stiffness, decreased range of motion, redness, swelling, muscle weakness, and gout.  Skin       Denies bruising, unusual mles/lumps or sores, and hair/skin or nail changes.  Psych       Denies mood changes, temper/anger  issues, anxiety/stress, speech problems, depression, and sleep problems. 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 Unremarkable  Past Surgical History: Reviewed history from 09/18/2009 and no changes required. Caesarean section Hysterectomy Tubal ligation  Family History: Reviewed history from 09/18/2009 and no  changes required. Mother, arthritis Father, Healthy  Social History: Reviewed history from 09/18/2009 and no changes required. 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 Physical Exam General appearance: well developed, well nourished, no acute distress Head: normocephalic, atraumatic Ears: normal, no lesions or deformities Oral/Pharynx: tongue normal, posterior pharynx without erythema or exudate Neck: neck supple,  trachea midline, no masses Chest/Lungs: no rales, wheezes, or rhonchi bilateral, breath sounds equal without effort Heart: regular rate and  rhythm, no murmur Skin: no obvious rashes or lesions MSE: oriented to time, place, and person Assessment Problems:   URI (ICD-465.9) New Problems: TWITCHING (ICD-781.0) TRACHEOBRONCHITIS (ICD-490)   Patient Education: Patient and/or caregiver instructed in the following: rest fluids and Tylenol.  Plan New Medications/Changes: Sandria Senter ER 8-10 MG/5ML LQCR (CHLORPHENIRAMINE-HYDROCODONE) 1/2 to 1 tsp by mouth twice a day  #4 floz x 0, 03/25/2011, Hassan Rowan MD ZITHROMAX Z-PAK 250 MG  TABS (AZITHROMYCIN) Use as directed  #1 x 0, 03/25/2011, Hassan Rowan MD  New Orders: Est. Patient Level III [[address]] Follow Up: Follow up in 2-3 days if no improvement, Follow up on an as needed basis, Follow up with Primary Physician  The patient and/or caregiver has been counseled thoroughly with regard to medications prescribed including dosage, schedule, interactions, rationale for use, and possible side effects and they verbalize understanding.  Diagnoses and expected course of recovery discussed and will return if not improved as expected or if the condition worsens. Patient and/or caregiver verbalized understanding.  Prescriptions: Sandria Senter ER 8-10 MG/5ML LQCR (CHLORPHENIRAMINE-HYDROCODONE) 1/2 to 1 tsp by mouth twice a day  #4 floz x 0   Entered and Authorized by:   Hassan Rowan MD   Signed by:    Hassan Rowan MD on 03/25/2011   Method used:   Printed then faxed to ...       [facility]* (retail)       [address] [place], [place]  [address]       Ph: [phone]       Fax: [phone]   RxID:   [id number] ZITHROMAX Z-PAK 250 MG  TABS (AZITHROMYCIN) Use as directed  #1 x 0   Entered and Authorized by:   Hassan Rowan MD   Signed by:   Hassan Rowan MD on 03/25/2011   Method used:   Printed then faxed to ...       [facility]* (retail)       [address] [place], [place]  [address]       Ph: [phone]       Fax: [phone]   RxID:   [id number]   Patient Instructions: 1)  For the eye twitching try to relax the eyes and do not fight the twitching 2)  Please schedule a follow-up appointment as needed. 3)  Please schedule an appointment with your primary doctor in :3-5 days if neeede 4)  Take your  antibiotic as prescribed until ALL of it is gone, but stop if you develop a rash or swelling and contact our office as soon as possible. 5)  Acute bronchitis symptoms for less than 10 days are not helped by antibiotics. take over the counter cough medications. call if no improvment in  5-7 days, sooner if increasing cough, fever, or new symptoms( shortness of breath, chest pain).  Orders Added: 1)  Est. Patient Level III [[address]]

## 2011-05-23 NOTE — Telephone Encounter (Signed)
  Phone Note Outgoing Call Call back at [facility] Phone [phone]   Call placed by: Emilio Math,  March 28, 2011 3:27 PM Call placed to: Patient Summary of Call: Called left msg hope pt is feeling better

## 2011-06-09 ENCOUNTER — Encounter: Payer: Self-pay | Admitting: Internal Medicine

## 2011-06-15 ENCOUNTER — Ambulatory Visit (INDEPENDENT_AMBULATORY_CARE_PROVIDER_SITE_OTHER): Payer: 59 | Admitting: Internal Medicine

## 2011-06-15 ENCOUNTER — Encounter: Payer: Self-pay | Admitting: Internal Medicine

## 2011-06-15 VITALS — BP 132/98 | HR 76 | Temp 97.1°F | Wt 196.0 lb

## 2011-06-15 DIAGNOSIS — Z23 Encounter for immunization: Secondary | ICD-10-CM

## 2011-06-15 MED ORDER — SERTRALINE HCL 50 MG PO TABS: 50.0000 mg | ORAL_TABLET | Freq: Every day | ORAL | Status: DC

## 2011-10-27 ENCOUNTER — Encounter: Payer: Self-pay | Admitting: Gastroenterology

## 2011-10-27 ENCOUNTER — Telehealth: Payer: Self-pay | Admitting: *Deleted

## 2011-10-27 NOTE — Telephone Encounter (Signed)
Spoke with [name] and advised her that she can self refer for colonoscopy . She was given the contact information for [facility] [phone]). She verbalized understanding.

## 2011-10-31 NOTE — Progress Notes (Signed)
This purpose of this visit was to receive the immunization confirmed by Dr. Debby Bud. Encounter closed on his behalf.

## 2011-11-22 ENCOUNTER — Encounter: Payer: Self-pay | Admitting: Gastroenterology

## 2011-11-22 ENCOUNTER — Ambulatory Visit (AMBULATORY_SURGERY_CENTER): Payer: 59

## 2011-11-22 VITALS — Ht 62.0 in | Wt 199.2 lb

## 2011-11-22 DIAGNOSIS — Z8371 Family history of colonic polyps: Secondary | ICD-10-CM

## 2011-11-22 DIAGNOSIS — Z1211 Encounter for screening for malignant neoplasm of colon: Secondary | ICD-10-CM

## 2011-11-22 MED ORDER — PEG-KCL-NACL-NASULF-NA ASC-C 100 G PO SOLR: 1.0000 | Freq: Once | ORAL | Status: AC

## 2011-12-05 ENCOUNTER — Encounter: Payer: 59 | Admitting: Gastroenterology

## 2012-01-02 ENCOUNTER — Ambulatory Visit (AMBULATORY_SURGERY_CENTER): Payer: 59 | Admitting: Gastroenterology

## 2012-01-02 VITALS — BP 130/69 | HR 83 | Temp 98.7°F | Resp 15 | Ht 62.0 in | Wt 199.0 lb

## 2012-01-02 DIAGNOSIS — Z1211 Encounter for screening for malignant neoplasm of colon: Secondary | ICD-10-CM

## 2012-01-02 DIAGNOSIS — Z8371 Family history of colonic polyps: Secondary | ICD-10-CM

## 2012-01-02 MED ORDER — SODIUM CHLORIDE 0.9 % IV SOLN: 500.0000 mL | INTRAVENOUS | Status: DC

## 2012-01-02 NOTE — Progress Notes (Signed)
Patient did not experience any of the following events: a burn prior to discharge; a fall within the facility; wrong site/side/patient/procedure/implant event; or a hospital transfer or hospital admission upon discharge from the facility. (G8907) Patient did not have preoperative order for IV antibiotic SSI prophylaxis. (G8918)  

## 2012-01-02 NOTE — Op Note (Signed)
 [facility] [address] [place], [place]  [address]  COLONOSCOPY PROCEDURE REPORT  PATIENT:  Laura, Hurst  MR#:  [id number] BIRTHDATE:  02-May-1961, 50 yrs. old  GENDER:  female ENDOSCOPIST:  Vania Rea. Jarold Motto, MD, [facility] REF. BY:  Rosalyn Gess. Norins, M.D. PROCEDURE DATE:  01/02/2012 PROCEDURE:  Average-risk screening colonoscopy G0121 ASA CLASS:  Class II INDICATIONS:  Routine Risk Screening MEDICATIONS:   propofol (Diprivan) 300 mg IV  DESCRIPTION OF PROCEDURE:   After the risks and benefits and of the procedure were explained, informed consent was obtained. Digital rectal exam was performed and revealed no abnormalities. The LB [id number] endoscope was introduced through the anus and advanced to the cecum, which was identified by both the appendix and ileocecal valve.  The quality of the prep was excellent, using MoviPrep.  The instrument was then slowly withdrawn as the colon was fully examined. <<PROCEDUREIMAGES>>  FINDINGS:  No polyps or cancers were seen.  This was otherwise a normal examination of the colon.   Retroflexed views in the rectum revealed no abnormalities.    The scope was then withdrawn from the patient and the procedure completed.  COMPLICATIONS:  None ENDOSCOPIC IMPRESSION: 1) No polyps or cancers 2) Otherwise normal examination RECOMMENDATIONS: 1) Continue current colorectal screening recommendations for "routine risk" patients with a repeat colonoscopy in 10 years.  REPEAT EXAM:  No  ______________________________ Vania Rea. Jarold Motto, MD, [facility]  CC:  n. eSIGNED:   Vania Rea. Patterson at 01/02/2012 10:19 AM  Rae Halsted, [phone]

## 2012-01-02 NOTE — Patient Instructions (Signed)
REPEAT COLONOSCOPY IN 5 YEARS  YOU HAD AN ENDOSCOPIC PROCEDURE TODAY AT THE [facility]: Refer to the procedure report that was given to you for any specific questions about what was found during the examination.  If the procedure report does not answer your questions, please call your gastroenterologist to clarify.  If you requested that your care partner not be given the details of your procedure findings, then the procedure report has been included in a sealed envelope for you to review at your convenience later.  YOU SHOULD EXPECT: Some feelings of bloating in the abdomen. Passage of more gas than usual.  Walking can help get rid of the air that was put into your GI tract during the procedure and reduce the bloating. If you had a lower endoscopy (such as a colonoscopy or flexible sigmoidoscopy) you may notice spotting of blood in your stool or on the toilet paper. If you underwent a bowel prep for your procedure, then you may not have a normal bowel movement for a few days.  DIET: Your first meal following the procedure should be a light meal and then it is ok to progress to your normal diet.  A half-sandwich or bowl of soup is an example of a good first meal.  Heavy or fried foods are harder to digest and may make you feel nauseous or bloated.  Likewise meals heavy in dairy and vegetables can cause extra gas to form and this can also increase the bloating.  Drink plenty of fluids but you should avoid alcoholic beverages for 24 hours.  ACTIVITY: Your care partner should take you home directly after the procedure.  You should plan to take it easy, moving slowly for the rest of the day.  You can resume normal activity the day after the procedure however you should NOT DRIVE or use heavy machinery for 24 hours (because of the sedation medicines used during the test).    SYMPTOMS TO REPORT IMMEDIATELY: A gastroenterologist can be reached at any hour.  During normal business hours, 8:30 AM to  5:00 PM Monday through Friday, call [phone].  After hours and on weekends, please call the GI answering service at [phone] who will take a message and have the physician on call contact you.   Following lower endoscopy (colonoscopy or flexible sigmoidoscopy):  Excessive amounts of blood in the stool  Significant tenderness or worsening of abdominal pains  Swelling of the abdomen that is new, acute  Fever of 100F or higher  Following upper endoscopy (EGD)  Vomiting of blood or coffee ground material  New chest pain or pain under the shoulder blades  Painful or persistently difficult swallowing  New shortness of breath  Fever of 100F or higher  Black, tarry-looking stools  FOLLOW UP: If any biopsies were taken you will be contacted by phone or by letter within the next 1-3 weeks.  Call your gastroenterologist if you have not heard about the biopsies in 3 weeks.  Our staff will call the home number listed on your records the next business day following your procedure to check on you and address any questions or concerns that you may have at that time regarding the information given to you following your procedure. This is a courtesy call and so if there is no answer at the home number and we have not heard from you through the emergency physician on call, we will assume that you have returned to your regular daily activities without incident.  SIGNATURES/CONFIDENTIALITY: You and/or your care partner have signed paperwork which will be entered into your electronic medical record.  These signatures attest to the fact that that the information above on your After Visit Summary has been reviewed and is understood.  Full responsibility of the confidentiality of this discharge information lies with you and/or your care-partner.

## 2012-01-03 ENCOUNTER — Telehealth: Payer: Self-pay | Admitting: *Deleted

## 2012-01-03 NOTE — Telephone Encounter (Signed)
  Follow up Call-  Call back number 01/02/2012  Post procedure Call Back phone  # [phone]  Permission to leave phone message Yes     Patient questions:  Do you have a fever, pain , or abdominal swelling? no Pain Score  0 *  Have you tolerated food without any problems? yes  Have you been able to return to your normal activities? yes  Do you have any questions about your discharge instructions: Diet   no Medications  no Follow up visit  no  Do you have questions or concerns about your Care? no  Actions: * If pain score is 4 or above: No action needed, pain <4.

## 2012-01-11 ENCOUNTER — Telehealth: Payer: Self-pay | Admitting: Internal Medicine

## 2012-01-11 NOTE — Telephone Encounter (Signed)
Caller: Ylianna/Patient; PCP: Illene Regulus; CB#: [phone]; Call regarding R. Lower Arm Pain; Onset: 01/06/12.  Afebrile.  Pain has extended to include wrist, hand and thumb. Had IV in same arm for colonoscopy; first attempt failed.  Denies lump or reedness of arm.  Advised to see MD withn 24 hrs for new onset mild to moderate pain that has not improved with 24 hrs of home care per Arm Injury Guideline.  Declined appt until 01/16/12 d/t work schedule. Only acute and same day appts remain for 01/16/12.  Info noted and sent to [facility] for call back to pt.

## 2012-01-11 NOTE — Telephone Encounter (Signed)
Add on to schedule for July 25th

## 2012-01-12 ENCOUNTER — Telehealth: Payer: Self-pay | Admitting: Internal Medicine

## 2012-01-12 ENCOUNTER — Ambulatory Visit (INDEPENDENT_AMBULATORY_CARE_PROVIDER_SITE_OTHER): Payer: 59 | Admitting: Internal Medicine

## 2012-01-12 ENCOUNTER — Ambulatory Visit: Payer: 59 | Admitting: Internal Medicine

## 2012-01-12 ENCOUNTER — Encounter: Payer: Self-pay | Admitting: Internal Medicine

## 2012-01-12 VITALS — BP 112/82 | HR 80 | Temp 97.8°F | Resp 16 | Wt 201.0 lb

## 2012-01-12 DIAGNOSIS — M65849 Other synovitis and tenosynovitis, unspecified hand: Secondary | ICD-10-CM

## 2012-01-12 DIAGNOSIS — M778 Other enthesopathies, not elsewhere classified: Secondary | ICD-10-CM

## 2012-01-12 DIAGNOSIS — M65839 Other synovitis and tenosynovitis, unspecified forearm: Secondary | ICD-10-CM

## 2012-01-12 NOTE — Telephone Encounter (Signed)
May add on to this PM schedule if needed.

## 2012-01-12 NOTE — Telephone Encounter (Signed)
Pt added

## 2012-01-12 NOTE — Telephone Encounter (Signed)
Caller: [name]/Patient; PCP: Illene Regulus; CB#: [phone]; ; ; Call regarding Arm/Hand Pain; Patient is calling to schedule an appointment for follow up from previous triage on 01/11/12. Appointment scheduled with Dr. Yetta Barre 01/12/12 at 10:15am. Patient was returning the call from yesterday. She was unable to answer the phone due to a problem at work.

## 2012-01-12 NOTE — Telephone Encounter (Signed)
Called pt, lvmom and asked her to call back.

## 2012-01-12 NOTE — Progress Notes (Signed)
  Subjective:    Patient ID: [name], female    DOB: 1960-12-22, 51 y.o.   MRN: [id number]  HPI Mrs. Droz presents for tenderness in the right wrist at the base of the 1st MCP joint and the forearm. She carries a computer bag on the right shoulder and drapes her purse straps on the right forearm. For several days she has had soreness. NO swelling is noted, no loss of sensation, no motor strength loss.  Past Medical History  Diagnosis Date  . TRACHEOBRONCHITIS 03/25/2011  . TWITCHING 03/25/2011  . URI 09/18/2009   Past Surgical History  Procedure Date  . Cesarean section   . Abdominal hysterectomy   . Tubal ligation    Family History  Problem Relation Age of Onset  . Arthritis Mother   . Healthy Father   . Colon polyps Father    History   Social History  . Marital Status: Married    Spouse Name: N/A    Number of Children: 1  . Years of Education: 16   Occupational History  . ACCOUNTANT    Social History Main Topics  . Smoking status: Never Smoker   . Smokeless tobacco: Never Used  . Alcohol Use: 0.5 oz/week    1 drink(s) per week     1 drink per wk  . Drug Use: No  . Sexually Active: Yes -- Female partner(s)   Other Topics Concern  . Not on file   Social History Narrative   HSG, [employer] - BS accounting. Married '82 - 7 years/divorced; '97 - . 1 son '84. Work - Systems analyst. Marriage in good health.     Current Outpatient Prescriptions on File Prior to Visit  Medication Sig Dispense Refill  . aspirin 325 MG tablet Take 325 mg by mouth daily.        Marland Kitchen estrogens, conjugated, (PREMARIN) 0.3 MG tablet Take 0.3 mg by mouth daily.       . Multiple Vitamins-Minerals (ONE-A-DAY EXTRAS ANTIOXIDANT PO) Take by mouth daily.        . Omega-3 Fatty Acids (FISH OIL) 1000 MG CAPS Take by mouth daily.        . Probiotic Product (ALIGN PO) Take by mouth daily.      . sertraline (ZOLOFT) 50 MG tablet Take 1 tablet (50 mg total) by mouth  daily.  90 tablet  3      Review of Systems System review is negative for any constitutional, cardiac, pulmonary, GI or neuro symptoms or complaints other than as described in the HPI.     Objective:   Physical Exam Filed Vitals:   01/12/12 1134  BP: 112/82  Pulse: 80  Temp: 97.8 F (36.6 C)  Resp: 16   Gen;l - WNWD white woman in no distress Pulm - normal respirations Cor - 2+ radial pulse, good capillary refill right finger-tips. MSK - tender with pronation against resistance, tender to palpation at the base of the right thumb, tender to palpation of the forearm, not tender at the epicondyle. Minimally positive Finkelstein's maneuver        Assessment & Plan:  Tendonitis - De Quervain's but not severe enough to justify steroid injection  Plan Heat; lineament; otc NSAIDs.  For lack of improvement - consider steroid injection.

## 2012-01-12 NOTE — Patient Instructions (Addendum)
Wrist pain - a little tendonitis at the base of the thumb (Dequerveins) from over use. There is no infection or joint problem. The forearm tenderness is more of the same. Plan - "baby" the right arm; heat is good (bayer heat patch) as is lineament, e.g. Aspercreme. May also take otc NSAIDs, e.g. Aleve or motrin as needed. Try to not over use the arem - lighten the load a little bit.   De Quervain's Disease Suzette Battiest disease is a condition often seen in racquet sports where there is a soreness (inflammation) in the cord like structures (tendons) which attach muscle to bone on the thumb side of the wrist. There may be a tightening of the tissuesaround the tendons. This condition is often helped by giving up or modifying the activity which caused it. When conservative treatment does not help, surgery may be required. Conservative treatment could include changes in the activity which brought about the problem or made it worse. Anti-inflammatory medications and injections may be used to help decrease the inflammation and help with pain control. Your caregiver will help you determine which is best for you. DIAGNOSIS   Often the diagnosis (learning what is wrong) can be made by examination. Sometimes x-rays are required. HOME CARE INSTRUCTIONS    Apply ice to the sore area for 15 to 20 minutes, 3 to 4 times per day while awake. Put the ice in a plastic bag and place a towel between the bag of ice and your skin. This is especially helpful if it can be done after all activities involving the sore wrist.   Temporary splinting may help.   Only take over-the-counter or prescription medicines for pain, discomfort or fever as directed by your caregiver.  SEEK MEDICAL CARE IF:    Pain relief is not obtained with medications, or if you have increasing pain and seem to be getting worse rather than better.  MAKE SURE YOU:    Understand these instructions.   Will watch your condition.   Will get help right  away if you are not doing well or get worse.  Document Released: 03/01/2001 Document Revised: 05/26/2011 Document Reviewed: 06/06/2005 [facility] Patient Information 2012 [place], [place].

## 2012-01-12 NOTE — Telephone Encounter (Signed)
Caller: [name]/Patient; PCP: Duncan Dull; CB#: [phone]; ; ; Call regarding Fever; Patient reports that she is having body aches and fever. Onset 01/11/12. Temp 102.1 Ax; Patient reports that she is taking Nyquil for these symptoms. All emergent s/s per Flu-Like Symptoms r/o with exception to "Flue-like symptoms and not in a high risk group." Home care advice given.

## 2012-06-28 ENCOUNTER — Other Ambulatory Visit: Payer: Self-pay | Admitting: Internal Medicine

## 2012-12-04 ENCOUNTER — Other Ambulatory Visit: Payer: Self-pay | Admitting: Internal Medicine

## 2013-01-28 ENCOUNTER — Other Ambulatory Visit: Payer: Self-pay | Admitting: Dermatology

## 2013-02-19 ENCOUNTER — Encounter: Payer: Self-pay | Admitting: *Deleted

## 2013-02-19 ENCOUNTER — Emergency Department
Admission: EM | Admit: 2013-02-19 | Discharge: 2013-02-19 | Disposition: A | Discharge status: Home / Self Care | Payer: 59 | Source: Home / Self Care | Attending: Emergency Medicine | Admitting: Emergency Medicine

## 2013-02-19 DIAGNOSIS — H6120 Impacted cerumen, unspecified ear: Secondary | ICD-10-CM

## 2013-02-19 DIAGNOSIS — H6123 Impacted cerumen, bilateral: Secondary | ICD-10-CM

## 2013-02-19 DIAGNOSIS — H9209 Otalgia, unspecified ear: Secondary | ICD-10-CM

## 2013-02-19 HISTORY — DX: Anxiety disorder, unspecified: F41.9

## 2013-02-19 MED ORDER — NEOMYCIN-POLYMYXIN-HC 3.5-10000-1 OT SUSP: 3.0000 [drp] | Freq: Three times a day (TID) | OTIC | Status: DC

## 2013-02-19 NOTE — ED Provider Notes (Signed)
CSN: [id number]     Arrival date & time 02/19/13  1818 History   First MD Initiated Contact with Patient 02/19/13 1824     Chief Complaint  Patient presents with  . Otalgia  . Ear Fullness   (Consider location/radiation/quality/duration/timing/severity/associated sxs/prior Treatment) HPI [name] is a 52 y.o. female who complains of onset of ear symptoms for a week.  The symptoms are constant and mild-moderate in severity.  She was at the beach last week no swimming.  She has been trying to get some wax herself.  She has a history of earwax impaction. No sore throat No cough No pleuritic pain No wheezing No nasal congestion No post-nasal drainage No sinus pain/pressure No chest congestion No itchy/red eyes + earache (fullness) bilateral  No hemoptysis No SOB No chills/sweats No fever No nausea No vomiting No abdominal pain No diarrhea No skin rashes No fatigue No myalgias No headache     Past Medical History  Diagnosis Date  . TRACHEOBRONCHITIS 03/25/2011  . TWITCHING 03/25/2011  . URI 09/18/2009   Past Surgical History  Procedure Laterality Date  . Cesarean section    . Abdominal hysterectomy    . Tubal ligation     Family History  Problem Relation Age of Onset  . Arthritis Mother   . Healthy Father   . Colon polyps Father    History  Substance Use Topics  . Smoking status: Never Smoker   . Smokeless tobacco: Never Used  . Alcohol Use: 0.5 oz/week    1 drink(s) per week     Comment: 1 drink per wk   OB History   Grav Para Term Preterm Abortions TAB SAB Ect Mult Living                 Review of Systems  All other systems reviewed and are negative.    Allergies  Macrodantin and Nitrofurantoin  Home Medications   Current Outpatient Rx  Name  Route  Sig  Dispense  Refill  . aspirin 325 MG tablet   Oral   Take 325 mg by mouth daily.           Marland Kitchen estrogens, conjugated, (PREMARIN) 0.3 MG tablet   Oral   Take 0.3 mg by mouth daily.           . Multiple Vitamins-Minerals (ONE-A-DAY EXTRAS ANTIOXIDANT PO)   Oral   Take by mouth daily.           Marland Kitchen neomycin-polymyxin-hydrocortisone (CORTISPORIN) 3.5-10000-1 otic suspension   Both Ears   Place 3 drops into both ears 3 (three) times daily.   10 mL   0   . Omega-3 Fatty Acids (FISH OIL) 1000 MG CAPS   Oral   Take by mouth daily.           . Probiotic Product (ALIGN PO)   Oral   Take by mouth daily.         . sertraline (ZOLOFT) 50 MG tablet      TAKE 1 TABLET DAILY (PATIENT NEEDS TO MAKE APPOINTMENT WITH DR. Debby Bud FOR FURTHER REFILLS)   90 tablet   3    There were no vitals taken for this visit. Physical Exam  Nursing note and vitals reviewed. Constitutional: She is oriented to person, place, and time. She appears well-developed and well-nourished.  HENT:  Head: Normocephalic and atraumatic.  Nose: Nose normal.  Mouth/Throat: Uvula is midline and oropharynx is clear and moist.  Bilateral ears have impacted cerumen.  After irrigation canals are clear.  However there is erythema and irritation to the ear canals L>R.  No perforations are seen.  Eyes: No scleral icterus.  Neck: Neck supple.  Cardiovascular: Regular rhythm and normal heart sounds.   Pulmonary/Chest: Effort normal and breath sounds normal. No respiratory distress.  Neurological: She is alert and oriented to person, place, and time.  Skin: Skin is warm and dry.  Psychiatric: She has a normal mood and affect. Her speech is normal.    ED Course  Procedures (including critical care time) Labs Review Labs Reviewed - No data to display Imaging Review No results found.  MDM   1. Cerumen impaction, bilateral     We irrigated both ears.  Afterward she did have some redness and irritation.  Because she also was at the beach swimming in the ocean last week with irritated ears I am to give her a prescription for antibiotic eardrops.  I advised her on the use.  If she's had any further problems she  can followup with her PCP.  Marlaine Hind, MD 02/19/13 [phone]

## 2013-02-19 NOTE — ED Notes (Signed)
Pt c/o her ears feel "stopped up" bilaterally x 3 days.

## 2013-03-11 ENCOUNTER — Ambulatory Visit (INDEPENDENT_AMBULATORY_CARE_PROVIDER_SITE_OTHER): Payer: 59 | Admitting: Internal Medicine

## 2013-03-11 ENCOUNTER — Encounter: Payer: Self-pay | Admitting: Internal Medicine

## 2013-03-11 ENCOUNTER — Other Ambulatory Visit (INDEPENDENT_AMBULATORY_CARE_PROVIDER_SITE_OTHER): Payer: 59

## 2013-03-11 VITALS — BP 100/76 | HR 68 | Temp 97.0°F | Ht 62.0 in | Wt 173.4 lb

## 2013-03-11 DIAGNOSIS — E785 Hyperlipidemia, unspecified: Secondary | ICD-10-CM

## 2013-03-11 DIAGNOSIS — Z0000 Encounter for general adult medical examination without abnormal findings: Secondary | ICD-10-CM | POA: Insufficient documentation

## 2013-03-11 LAB — COMPREHENSIVE METABOLIC PANEL
Alkaline Phosphatase: 80 U/L (ref 39–117)
Calcium: 9.5 mg/dL (ref 8.4–10.5)
Creatinine, Ser: 0.5 mg/dL (ref 0.4–1.2)
Glucose, Bld: 92 mg/dL (ref 70–99)
Sodium: 140 mEq/L (ref 135–145)
Total Bilirubin: 0.6 mg/dL (ref 0.3–1.2)
Total Protein: 7.5 g/dL (ref 6.0–8.3)

## 2013-03-11 LAB — CBC WITH DIFFERENTIAL/PLATELET
Basophils Absolute: 0 10*3/uL (ref 0.0–0.1)
Basophils Relative: 0.7 % (ref 0.0–3.0)
Eosinophils Relative: 1.6 % (ref 0.0–5.0)
Hemoglobin: 13.1 g/dL (ref 12.0–15.0)
Lymphocytes Relative: 28.7 % (ref 12.0–46.0)
Lymphs Abs: 1.8 10*3/uL (ref 0.7–4.0)
Monocytes Relative: 5.6 % (ref 3.0–12.0)
Neutro Abs: 4 10*3/uL (ref 1.4–7.7)
Neutrophils Relative %: 63.4 % (ref 43.0–77.0)
Platelets: 271 10*3/uL (ref 150.0–400.0)
RDW: 14.6 % (ref 11.5–14.6)
WBC: 6.4 10*3/uL (ref 4.5–10.5)

## 2013-03-11 LAB — LIPID PANEL
Cholesterol: 215 mg/dL — ABNORMAL HIGH (ref 0–200)
Total CHOL/HDL Ratio: 4
VLDL: 10.2 mg/dL (ref 0.0–40.0)

## 2013-03-11 LAB — LDL CHOLESTEROL, DIRECT: Direct LDL: 147.5 mg/dL

## 2013-03-11 NOTE — Progress Notes (Signed)
Subjective:    Patient ID: [name], female    DOB: 10-Apr-1961, 52 y.o.   MRN: [id number]  HPI Mrs. Dail presents for a routine medical exam. No major illness, no surgery or injury. She has an appointment with Gyn - Dr. Henderson Cloud coming up. She is current with dentist, she has had an eye exam in the last 12 months. She is current with mammography.   Past Medical History  Diagnosis Date  . Anxiety    Past Surgical History  Procedure Laterality Date  . Cesarean section    . Abdominal hysterectomy    . Tubal ligation     Family History  Problem Relation Age of Onset  . Arthritis Mother   . Healthy Father   . Colon polyps Father    History   Social History  . Marital Status: Married    Spouse Name: N/A    Number of Children: 1  . Years of Education: 16   Occupational History  . ACCOUNTANT    Social History Main Topics  . Smoking status: Never Smoker   . Smokeless tobacco: Never Used  . Alcohol Use: 0.5 oz/week    1 drink(s) per week     Comment: 1 drink per wk  . Drug Use: No  . Sexual Activity: Yes    Partners: Female   Other Topics Concern  . Not on file   Social History Narrative   HSG, [employer] - BS accounting. Married '82 - 7 years/divorced; '97 - . 1 son '84. 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 Marriage in good health.    Current Outpatient Prescriptions on File Prior to Visit  Medication Sig Dispense Refill  . aspirin 325 MG tablet Take 325 mg by mouth daily.        Marland Kitchen estrogens, conjugated, (PREMARIN) 0.3 MG tablet Take 0.3 mg by mouth daily.       . Multiple Vitamins-Minerals (ONE-A-DAY EXTRAS ANTIOXIDANT PO) Take by mouth daily.        Marland Kitchen neomycin-polymyxin-hydrocortisone (CORTISPORIN) 3.5-10000-1 otic suspension Place 3 drops into both ears 3 (three) times daily.  10 mL  0  . Omega-3 Fatty Acids (FISH OIL) 1000 MG CAPS Take by mouth daily.        . sertraline (ZOLOFT) 50 MG tablet TAKE 1 TABLET DAILY (PATIENT  NEEDS TO MAKE APPOINTMENT WITH DR. Debby Bud FOR FURTHER REFILLS)  90 tablet  3   No current facility-administered medications on file prior to visit.       Review of Systems Constitutional:  Negative for fever, chills, activity change and unexpected weight change.  HEENT:  Negative for hearing loss, ear pain, congestion, neck stiffness and postnasal drip. Negative for sore throat or swallowing problems. Negative for dental complaints.   Eyes: Negative for vision loss or change in visual acuity.  Respiratory: Negative for chest tightness and wheezing. Negative for DOE.   Cardiovascular: Negative for chest pain or palpitations. No decreased exercise tolerance Gastrointestinal: No change in bowel habit. No bloating or gas. No reflux or indigestion Genitourinary: Negative for urgency, frequency, flank pain and difficulty urinating.  Musculoskeletal: Negative for myalgias, back pain, arthralgias and gait problem.  Neurological: Negative for dizziness, tremors, weakness and headaches.  Hematological: Negative for adenopathy.  Psychiatric/Behavioral: Negative for behavioral problems and dysphoric mood.       Objective:   Physical Exam Filed Vitals:   03/11/13 0855  BP: 100/76  Pulse: 68  Temp: 97 F (36.1 C)  Wt Readings from Last 3 Encounters:  03/11/13 173 lb 6.4 oz (78.654 kg)  02/19/13 177 lb (80.287 kg)  01/12/12 201 lb (91.173 kg)   Gen'l: well nourished, well developed Woman in no distress HEENT - [place]/AT, EACs/TMs normal, oropharynx with native dentition in good condition, no buccal or palatal lesions, posterior pharynx clear, mucous membranes moist. C&S clear, PERRLA, fundi - normal Neck - supple, no thyromegaly Nodes- negative submental, cervical, supraclavicular regions Chest - no deformity, no CVAT Lungs - clear without rales, wheezes. No increased work of breathing Breast - deferred to gyn Cardiovascular - regular rate and rhythm, quiet precordium, no murmurs, rubs or  gallops, 2+ radial, DP and PT pulses Abdomen - BS+ x 4, no HSM, no guarding or rebound or tenderness Pelvic - deferred to gyn Rectal - deferred to gyn Extremities - no clubbing, cyanosis, edema or deformity.  Neuro - A&O x 3, CN II-XII normal, motor strength normal and equal, DTRs 2+ and symmetrical biceps, radial, and patellar tendons. Cerebellar - no tremor, no rigidity, fluid movement and normal gait. Derm - Head, neck, back, abdomen and extremities without suspicious lesions        Assessment & Plan:

## 2013-03-11 NOTE — Patient Instructions (Addendum)
Thanks for coming to see me.  You seem to have a good year. Your exam is normal. Labs are ordered and results will be posted to MyChart.  You are current with immunizations and health maintenance. Please have a coppy of your mammogram forwarded to me. Also send a MyChart message when you get the flu shot.  Keep up the exercise - your health is your job.  See you next year, sooner if you need [place].

## 2013-03-11 NOTE — Assessment & Plan Note (Signed)
Interval history is unremarkable. Limited physical exam is normal. Labs pending. She is current with colorectal screening, breast cancer screening and she is scheduled for Gyn exam. Immunizations are up to date and she will report back via MyChart when she gets her flu shot.  In summary a very nice woman who is medically stable. She will return in 1 year or prn.

## 2013-03-14 ENCOUNTER — Other Ambulatory Visit: Payer: Self-pay | Admitting: Internal Medicine

## 2013-03-14 DIAGNOSIS — E7800 Pure hypercholesterolemia, unspecified: Secondary | ICD-10-CM

## 2013-04-25 ENCOUNTER — Other Ambulatory Visit: Payer: Self-pay

## 2013-09-05 ENCOUNTER — Other Ambulatory Visit: Payer: Self-pay | Admitting: Obstetrics and Gynecology

## 2014-03-29 ENCOUNTER — Emergency Department
Admission: EM | Admit: 2014-03-29 | Discharge: 2014-03-29 | Disposition: A | Discharge status: Home / Self Care | Payer: 59 | Source: Home / Self Care | Attending: Family Medicine | Admitting: Family Medicine

## 2014-03-29 ENCOUNTER — Encounter: Payer: Self-pay | Admitting: Emergency Medicine

## 2014-03-29 DIAGNOSIS — H6123 Impacted cerumen, bilateral: Secondary | ICD-10-CM

## 2014-03-29 DIAGNOSIS — Z23 Encounter for immunization: Secondary | ICD-10-CM

## 2014-03-29 MED ORDER — INFLUENZA VAC SPLIT QUAD 0.5 ML IM SUSP
0.5000 mL | Freq: Once | INTRAMUSCULAR | Status: AC
  Administered 2014-03-29: 0.5 mL via INTRAMUSCULAR

## 2014-03-29 NOTE — Discharge Instructions (Signed)
Thank you for coming in today. ° ° °Cerumen Impaction °A cerumen impaction is when the wax in your ear forms a plug. This plug usually causes reduced hearing. Sometimes it also causes an earache or dizziness. Removing a cerumen impaction can be difficult and painful. The wax sticks to the ear canal. The canal is sensitive and bleeds easily. If you try to remove a heavy wax buildup with a cotton tipped swab, you may push it in further. °Irrigation with water, suction, and small ear curettes may be used to clear out the wax. If the impaction is fixed to the skin in the ear canal, ear drops may be needed for a few days to loosen the wax. People who build up a lot of wax frequently can use ear wax removal products available in your local drugstore. °SEEK MEDICAL CARE IF:  °You develop an earache, increased hearing loss, or marked dizziness. °Document Released: 07/14/2004 Document Revised: 08/29/2011 Document Reviewed: 09/03/2009 °ExitCare® Patient Information ©2015 [employer]. This information is not intended to replace advice given to you by your health care provider. Make sure you discuss any questions you have with your health care provider. ° °

## 2014-03-29 NOTE — ED Provider Notes (Signed)
[name] is a 53 y.o. female who presents to Urgent Care today for ear irritation. Patient is a several week history of bilateral ear itching irritation and a fluid sensation. She frequently gets cerumen impaction and often requires ear irrigation. No fevers or chills nausea vomiting or diarrhea. No medications tried yet.  Patient would also like a flu vaccine today if possible  Past Medical History  Diagnosis Date  . Anxiety    History  Substance Use Topics  . Smoking status: Never Smoker   . Smokeless tobacco: Never Used  . Alcohol Use: 0.5 oz/week    1 drink(s) per week     Comment: 1 drink per wk   ROS as above Medications: No current facility-administered medications for this encounter.   Current Outpatient Prescriptions  Medication Sig Dispense Refill  . aspirin 325 MG tablet Take 325 mg by mouth daily.        Marland Kitchen. estrogens, conjugated, (PREMARIN) 0.3 MG tablet Take 0.3 mg by mouth daily.       . Multiple Vitamins-Minerals (ONE-A-DAY EXTRAS ANTIOXIDANT PO) Take by mouth daily.        . Omega-3 Fatty Acids (FISH OIL) 1000 MG CAPS Take by mouth daily.        . sertraline (ZOLOFT) 50 MG tablet TAKE 1 TABLET DAILY (PATIENT NEEDS TO MAKE APPOINTMENT WITH DR. Debby BudNORINS FOR FURTHER REFILLS)  90 tablet  3  . UNABLE TO FIND Dr Weber CooksSchulze's Herbal supplement Intestinal Formula        Exam:  BP 115/77  Pulse 74  Temp(Src) 97.8 F (36.6 C) (Oral)  Resp 16  Ht 5\' 2"  (1.575 m)  Wt 196 lb (88.905 kg)  BMI 35.84 kg/m2  SpO2 96% Gen: Well NAD HEENT: EOMI,  MMM cerumen partially occluding the tympanic membranes. Normal posterior pharynx.  Lungs: Normal work of breathing. CTABL Heart: RRR no MRG Abd: NABS, Soft. Nondistended, Nontender Exts: Brisk capillary refill, warm and well perfused.   The cerumen was removed with irrigation. Patient felt much better.  No results found for this or any previous visit (from the past 24 hour(s)). No results found.  Assessment and Plan: 53  y.o. female with  1) cerumen impaction: Relieved with irrigation. Followup with PCP as needed. 2) influenza vaccine provided today  Discussed warning signs or symptoms. Please see discharge instructions. Patient expresses understanding.     Rodolph BongEvan S Ferdinando Lodge, MD 03/29/14 [phone]

## 2014-03-29 NOTE — ED Notes (Signed)
States periodically has irritation in ears and itching and has irrigation/cleaning done. Would like Flu vaccination today.

## 2014-05-02 ENCOUNTER — Other Ambulatory Visit: Payer: Self-pay | Admitting: Obstetrics and Gynecology

## 2014-05-20 ENCOUNTER — Telehealth: Payer: Self-pay

## 2014-05-20 NOTE — Telephone Encounter (Signed)
Rec'd records from [facility] Forwarding 2pgs to GI

## 2016-01-15 ENCOUNTER — Other Ambulatory Visit: Payer: Self-pay | Admitting: Obstetrics and Gynecology

## 2018-03-23 ENCOUNTER — Other Ambulatory Visit: Payer: Self-pay | Admitting: Obstetrics and Gynecology

## 2018-03-23 DIAGNOSIS — R928 Other abnormal and inconclusive findings on diagnostic imaging of breast: Secondary | ICD-10-CM

## 2018-03-27 ENCOUNTER — Ambulatory Visit
Admission: RE | Admit: 2018-03-27 | Discharge: 2018-03-27 | Disposition: A | Discharge status: Home / Self Care | Payer: 59 | Source: Ambulatory Visit | Attending: Obstetrics and Gynecology | Admitting: Obstetrics and Gynecology

## 2018-03-27 ENCOUNTER — Ambulatory Visit: Payer: Self-pay

## 2018-03-27 DIAGNOSIS — R928 Other abnormal and inconclusive findings on diagnostic imaging of breast: Secondary | ICD-10-CM

## 2019-04-02 ENCOUNTER — Emergency Department (INDEPENDENT_AMBULATORY_CARE_PROVIDER_SITE_OTHER)
Admission: EM | Admit: 2019-04-02 | Discharge: 2019-04-02 | Disposition: A | Discharge status: Home / Self Care | Payer: 59 | Source: Home / Self Care

## 2019-04-02 ENCOUNTER — Other Ambulatory Visit: Payer: Self-pay

## 2019-04-02 DIAGNOSIS — Z20828 Contact with and (suspected) exposure to other viral communicable diseases: Secondary | ICD-10-CM

## 2019-04-02 DIAGNOSIS — R519 Headache, unspecified: Secondary | ICD-10-CM | POA: Diagnosis not present

## 2019-04-02 DIAGNOSIS — Z20822 Contact with and (suspected) exposure to covid-19: Secondary | ICD-10-CM

## 2019-04-02 NOTE — Discharge Instructions (Addendum)
°  You may take 500mg  acetaminophen every 4-6 hours or in combination with ibuprofen 400-600mg  every 6-8 hours as needed for pain.  It is important to get at least 8 hours of sleep at night and stay well hydrated.  You may try sinus rinses to help with your head pain/pressure.  Your covid test can take 2-10 days to result. Do not go out unless an urgent or emergent need, and if you must go out, always wear a face mask, wash hands frequently and try to stay 6 feet away from others, especially until the results come back. Tests are most accurate when they are performed 5-7 days after known exposure and symptoms have started.  Because of this, if you develop worsening symptoms, please contact your family doctor to see if they recommend a repeat test. They may direct you to a drive-through testing site.  You will only be called with POSITIVE results.  You may check all results, positive or negative, in your free [facility] MyChart account.

## 2019-04-02 NOTE — ED Triage Notes (Signed)
Pt c/o severe headache since waking up this morning at 5am. Took 3 advil at 8am and 2 tylenol at 10am. Pain 5/10

## 2019-04-02 NOTE — ED Provider Notes (Signed)
[facility]    CSN: [id number] Arrival date & time: 04/02/19  1255      History   Chief Complaint Chief Complaint  Patient presents with  . Headache    HPI [name] is a 58 y.o. female.   HPI  [name] is a 58 y.o. female presenting to UC with c/o generalized HA that started this morning since waking up around 5AM.  She took 3 Advil at 8AM and 2 Tylenol at 10AM.  HA improved even more after eating just PTA.  Pain is aching and sore. She does not have a hx of migraines but has had headaches in the past.  Denies fever, chills, sore throat, cough, congestion, n/v/d.  Denies change in vision, weakness or numbness in arms or legs. No recent URI or sinus symptoms. Patient's main concern is possible Covid after being "out and about" a lot more this weekend in [place] with her grandchild. Pt does wear a mask in public and has no known exposure but notes her grandchild touches a lot of things, which makes pt concerned she may have caught the virus through him.  He is not having any symptoms. Pt also notes she is concerned for her parents due to them being in their [id number].     Past Medical History:  Diagnosis Date  . Anxiety     Patient Active Problem List   Diagnosis Date Noted  . Routine health maintenance 03/11/2013    Past Surgical History:  Procedure Laterality Date  . ABDOMINAL HYSTERECTOMY    . CESAREAN SECTION    . TUBAL LIGATION      OB History   No obstetric history on file.      Home Medications    Prior to Admission medications   Medication Sig Start Date End Date Taking? Authorizing Provider  aspirin 325 MG tablet Take 325 mg by mouth daily.      [provider]  estrogens, conjugated, (PREMARIN) 0.3 MG tablet Take 0.3 mg by mouth daily.     [provider]  Multiple Vitamins-Minerals (ONE-A-DAY EXTRAS ANTIOXIDANT PO) Take by mouth daily.      [provider]  Omega-3 Fatty Acids (FISH OIL) 1000 MG  CAPS Take by mouth daily.      [provider]  sertraline (ZOLOFT) 50 MG tablet TAKE 1 TABLET DAILY (PATIENT NEEDS TO MAKE APPOINTMENT WITH DR. Debby BudNORINS FOR FURTHER REFILLS) 12/04/12   Norins, Rosalyn GessMichael E, MD  UNABLE TO FIND Dr Weber CooksSchulze's Herbal supplement Intestinal Formula    [provider]    Family History Family History  Problem Relation Age of Onset  . Arthritis Mother   . Healthy Father   . Colon polyps Father     Social History Social History   Tobacco Use  . Smoking status: Never Smoker  . Smokeless tobacco: Never Used  Substance Use Topics  . Alcohol use: Yes    Alcohol/week: 1.0 standard drinks    Types: 1 Standard drinks or equivalent per week    Comment: 1 drink per wk  . Drug use: No     Allergies   Macrodantin [nitrofurantoin macrocrystal] and Nitrofurantoin   Review of Systems Review of Systems  Constitutional: Negative for chills and fever.  HENT: Positive for congestion (mild). Negative for ear pain, sore throat, trouble swallowing and voice change.   Eyes: Negative for pain and visual disturbance.  Respiratory: Negative for cough and shortness of breath.   Cardiovascular: Negative  for chest pain and palpitations.  Gastrointestinal: Negative for abdominal pain, diarrhea, nausea and vomiting.  Musculoskeletal: Negative for arthralgias, back pain, myalgias, neck pain and neck stiffness.  Skin: Negative for rash.  Neurological: Positive for headaches. Negative for dizziness and light-headedness.     Physical Exam Triage Vital Signs ED Triage Vitals  Enc Vitals Group     BP 04/02/19 1308 (!) 143/90     Pulse Rate 04/02/19 1308 74     Resp 04/02/19 1308 18     Temp 04/02/19 1308 97.9 F (36.6 C)     Temp Source 04/02/19 1308 Oral     SpO2 04/02/19 1308 98 %     Weight 04/02/19 1309 202 lb (91.6 kg)     Height 04/02/19 1309 5\' 2"  (1.575 m)     Head Circumference --      Peak Flow --      Pain Score 04/02/19 1309 5     Pain Loc --       Pain Edu? --      Excl. in GC? --    No data found.  Updated Vital Signs BP (!) 143/90 (BP Location: Right Arm)   Pulse 74   Temp 97.9 F (36.6 C) (Oral)   Resp 18   Ht 5\' 2"  (1.575 m)   Wt 202 lb (91.6 kg)   SpO2 98%   BMI 36.95 kg/m   Visual Acuity Right Eye Distance:   Left Eye Distance:   Bilateral Distance:    Right Eye Near:   Left Eye Near:    Bilateral Near:     Physical Exam Vitals signs and nursing note reviewed.  Constitutional:      Appearance: She is well-developed.  HENT:     Head: Normocephalic and atraumatic.     Right Ear: Tympanic membrane normal.     Left Ear: Tympanic membrane normal.     Nose: Mucosal edema present. No congestion or rhinorrhea.     Right Sinus: Maxillary sinus tenderness and frontal sinus tenderness present.     Left Sinus: Maxillary sinus tenderness and frontal sinus tenderness present.     Comments: Mild sinus tenderness    Mouth/Throat:     Lips: Pink.     Mouth: Mucous membranes are moist.     Pharynx: Oropharynx is clear. Uvula midline.  Eyes:     General: No visual field deficit. Neck:     Musculoskeletal: Normal range of motion.  Cardiovascular:     Rate and Rhythm: Normal rate and regular rhythm.  Pulmonary:     Effort: Pulmonary effort is normal. No respiratory distress.     Breath sounds: Normal breath sounds. No stridor. No wheezing, rhonchi or rales.  Musculoskeletal: Normal range of motion.  Skin:    General: Skin is warm and dry.  Neurological:     Mental Status: She is alert and oriented to person, place, and time.     GCS: GCS eye subscore is 4. GCS verbal subscore is 5. GCS motor subscore is 6.     Cranial Nerves: No cranial nerve deficit, dysarthria or facial asymmetry.     Sensory: No sensory deficit.     Motor: No weakness.     Coordination: Romberg sign negative. Coordination normal.     Gait: Gait normal.  Psychiatric:        Mood and Affect: Mood normal.        Behavior: Behavior normal.       UC Treatments /  Results  Labs (all labs ordered are listed, but only abnormal results are displayed) Labs Reviewed  NOVEL CORONAVIRUS, NAA    EKG   Radiology No results found.  Procedures Procedures (including critical care time)  Medications Ordered in UC Medications - No data to display  Initial Impression / Assessment and Plan / UC Course  I have reviewed the triage vital signs and the nursing notes.  Pertinent labs & imaging results that were available during my care of the patient were reviewed by me and considered in my medical decision making (see chart for details).    Pt appears well, NAD Mild frontal and maxillary sinus tenderness with nasal mucosa edema. HA most c/w a sinus HA. Doubt [facility] or [facility] Covid-19 unlikely, however, due to patient's concerns, will perform send-out Covid test AVS provided  Final Clinical Impressions(s) / UC Diagnoses   Final diagnoses:  Generalized headache  Frontal headache  Encounter for laboratory testing for COVID-19 virus     Discharge Instructions      You may take 500mg  acetaminophen every 4-6 hours or in combination with ibuprofen 400-600mg  every 6-8 hours as needed for pain.  It is important to get at least 8 hours of sleep at night and stay well hydrated.  You may try sinus rinses to help with your head pain/pressure.  Your covid test can take 2-10 days to result. Do not go out unless an urgent or emergent need, and if you must go out, always wear a face mask, wash hands frequently and try to stay 6 feet away from others, especially until the results come back. Tests are most accurate when they are performed 5-7 days after known exposure and symptoms have started.  Because of this, if you develop worsening symptoms, please contact your family doctor to see if they recommend a repeat test. They may direct you to a drive-through testing site.  You will only be called with POSITIVE results.  You may check all results,  positive or negative, in your free [facility] MyChart account.     ED Prescriptions    None     PDMP not reviewed this encounter.   Noe Gens, [place] 04/02/19 [phone]

## 2019-04-03 LAB — NOVEL CORONAVIRUS, NAA: SARS-CoV-2, NAA: NOT DETECTED

## 2019-10-11 ENCOUNTER — Other Ambulatory Visit: Payer: Self-pay | Admitting: Family Medicine

## 2019-10-11 DIAGNOSIS — R519 Headache, unspecified: Secondary | ICD-10-CM

## 2019-10-11 DIAGNOSIS — R479 Unspecified speech disturbances: Secondary | ICD-10-CM

## 2019-10-11 DIAGNOSIS — G459 Transient cerebral ischemic attack, unspecified: Secondary | ICD-10-CM

## 2019-11-05 ENCOUNTER — Other Ambulatory Visit: Payer: Self-pay | Admitting: Family Medicine

## 2019-11-06 ENCOUNTER — Other Ambulatory Visit: Payer: Self-pay

## 2019-11-06 ENCOUNTER — Ambulatory Visit
Admission: RE | Admit: 2019-11-06 | Discharge: 2019-11-06 | Disposition: A | Discharge status: Home / Self Care | Payer: 59 | Source: Ambulatory Visit | Attending: Family Medicine | Admitting: Family Medicine

## 2019-11-06 DIAGNOSIS — R479 Unspecified speech disturbances: Secondary | ICD-10-CM

## 2019-11-06 DIAGNOSIS — G459 Transient cerebral ischemic attack, unspecified: Secondary | ICD-10-CM

## 2019-11-06 DIAGNOSIS — R519 Headache, unspecified: Secondary | ICD-10-CM

## 2019-11-06 MED ORDER — GADOBENATE DIMEGLUMINE 529 MG/ML IV SOLN
20.0000 mL | Freq: Once | INTRAVENOUS | Status: AC | PRN
  Administered 2019-11-06: 20 mL via INTRAVENOUS

## 2020-11-03 IMAGING — MR MR HEAD WO/W CM
12 series · 43 of 48 positions shown · IV contrast (20ml Multihance)
Comparison: 06/12/2010

CLINICAL DATA: Speech disturbance.  Headaches.

EXAM:
MRI HEAD WITHOUT AND WITH CONTRAST
TECHNIQUE: Multiplanar, multiecho pulse sequences of the brain and surrounding
structures were obtained without and with intravenous contrast.
CONTRAST:  20mL MULTIHANCE GADOBENATE DIMEGLUMINE 529 MG/ML IV SOLN

[Series 2: T1 · sagittal · 5.0mm · 0.60mm/px · 1 of 21 slices shown]
[im 1/21]
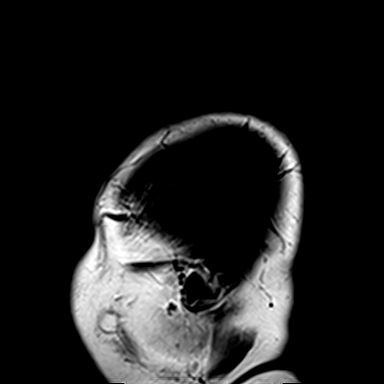

[Series 3: DWI · axial · 3.0mm · 1.80mm/px · z∈[-85,+62]mm · 6 of 100 slices shown (1 of 4)]
[im 1/100]
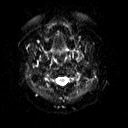
[im 20/100]
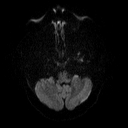
[im 40/100]
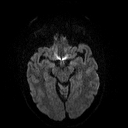
[im 60/100]
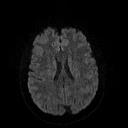
[im 80/100]
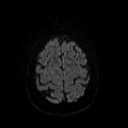
[im 100/100]
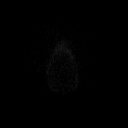

[Series 4: DWI · axial · 3.0mm · 1.80mm/px · z∈[-85,+62]mm · 3 of 50 slices shown (2 of 4)]
[im 1/50]
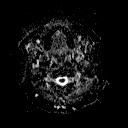
[im 25/50]
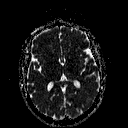
[im 50/50]
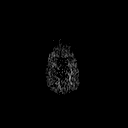

[Series 6: swi_images · axial · 2.0mm · 0.90mm/px · z∈[-89,+69]mm · 5 of 80 slices shown]
[im 1/80]
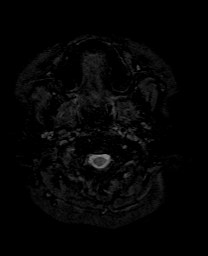
[im 20/80]
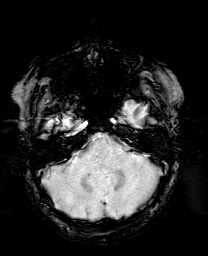
[im 40/80]
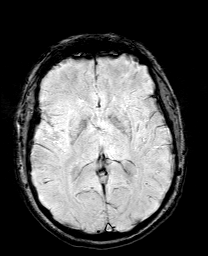
[im 60/80]
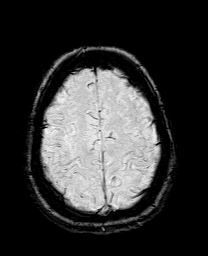
[im 80/80]
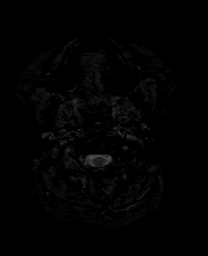

[Series 7: DWI · coronal · 5.0mm · 1.80mm/px · 4 of 68 slices shown (3 of 4)]
[im 1/68]
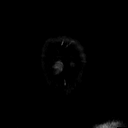
[im 23/68]
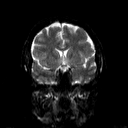
[im 45/68]
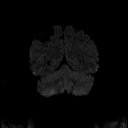
[im 68/68]
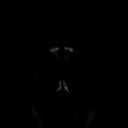

[Series 8: DWI · coronal · 5.0mm · 1.80mm/px · 2 of 34 slices shown (4 of 4)]
[im 1/34]
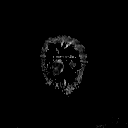
[im 34/34]
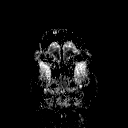

[Series 9: T2 · axial · 5.0mm · 0.60mm/px · 1 of 22 slices shown (1 of 2)]
[im 1/22]
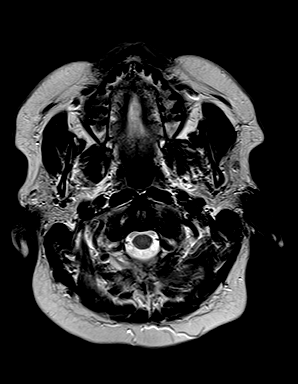

[Series 10: FLAIR · axial · 3.0mm · 0.60mm/px · z∈[-89,+67]mm · 2 of 27 slices shown]
[im 1/27]
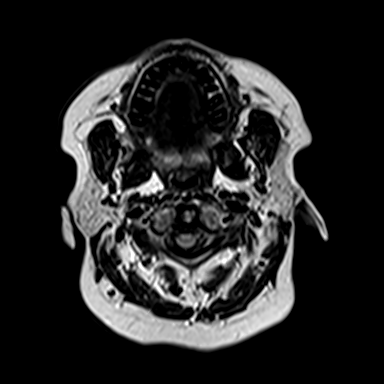
[im 27/27]
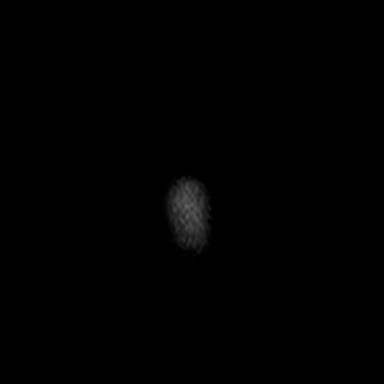

[Series 11: t1_mpr_tra copy center · axial · 1.0mm · 0.45mm/px · z∈[-89,+69]mm · 8 of 160 slices shown]
[im 1/160]
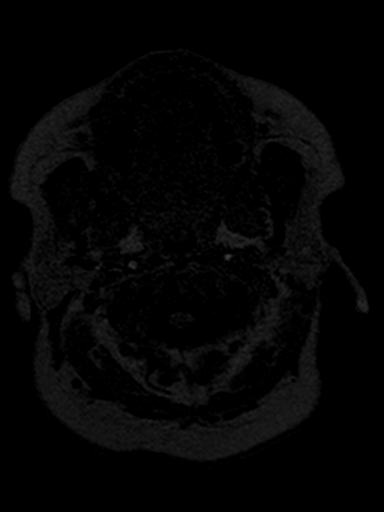
[im 18/160]
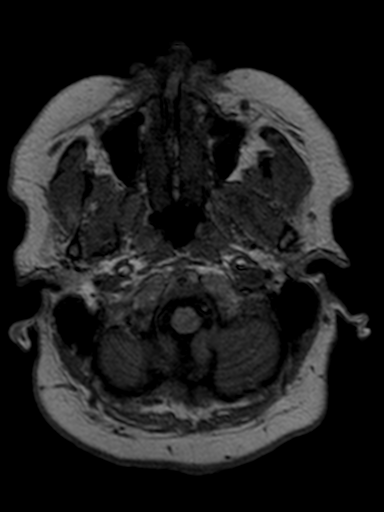
[im 54/160]
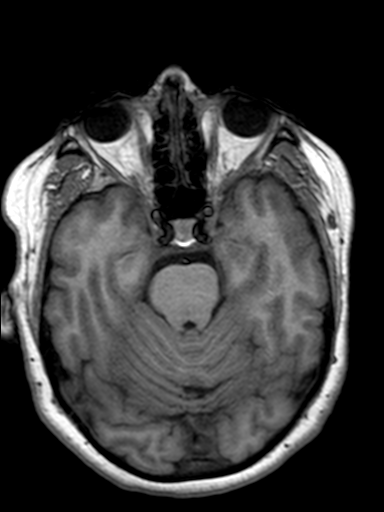
[im 71/160]
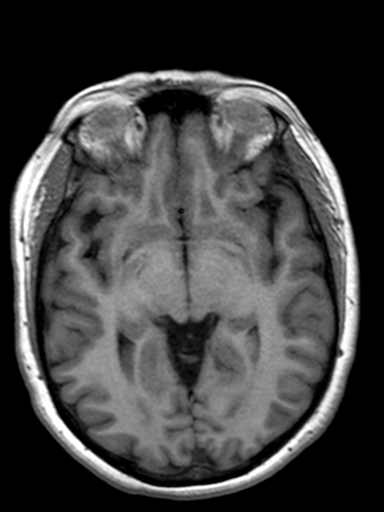
[im 89/160]
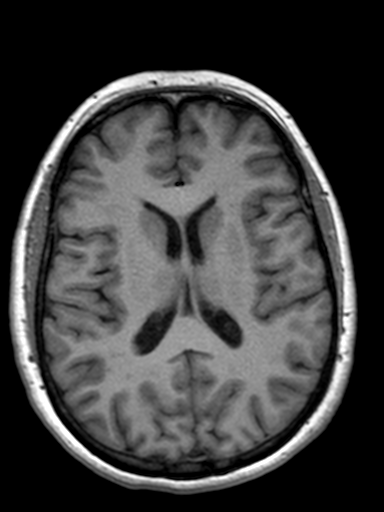
[im 107/160]
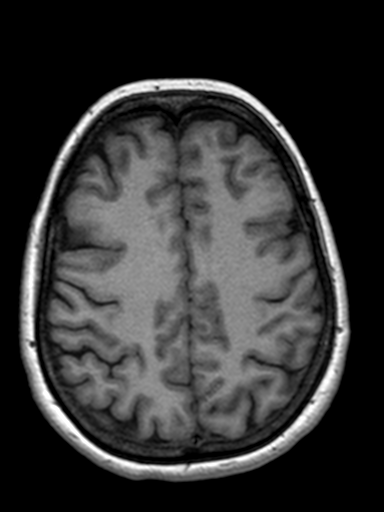
[im 142/160]
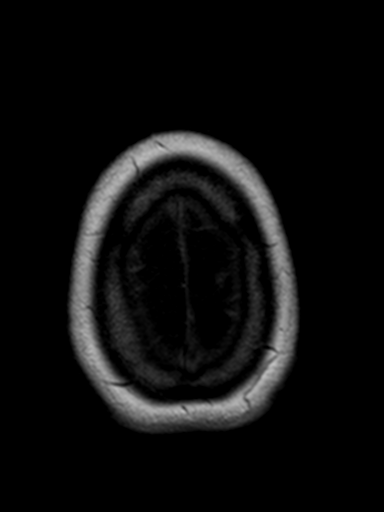
[im 160/160]
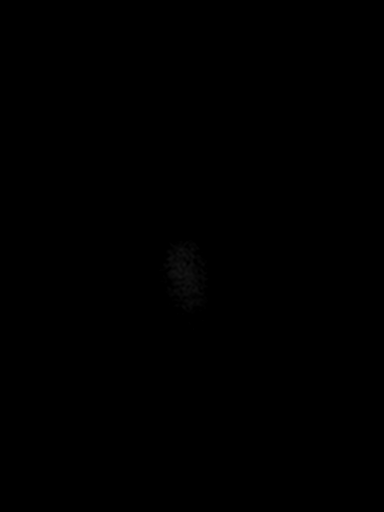

[Series 12: T2 · coronal · 5.0mm · 0.45mm/px · 2 of 29 slices shown (2 of 2)]
[im 1/29]
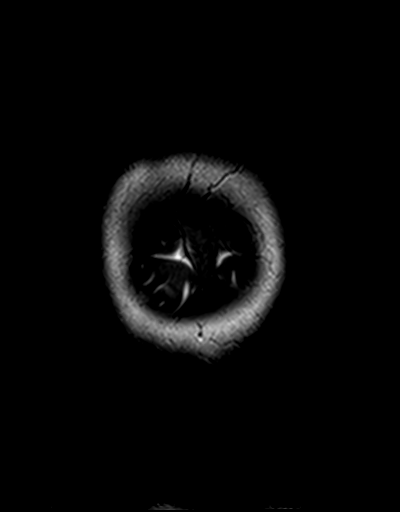
[im 29/29]
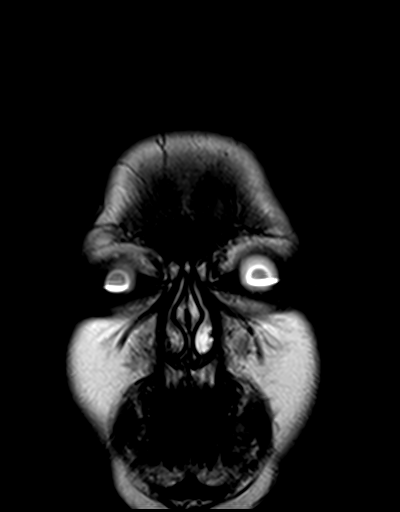

[Series 13: t1_mpr_tra · axial · 1.0mm · 0.45mm/px · z∈[-89,+69]mm · 8 of 160 slices shown]
[im 1/160]
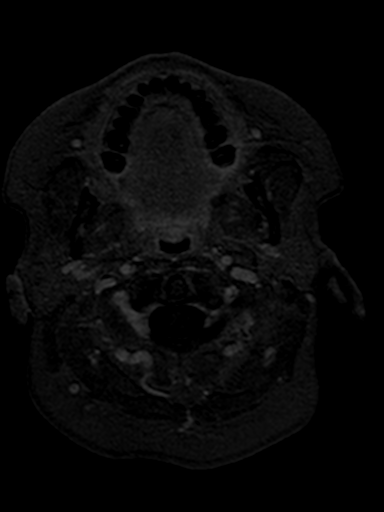
[im 18/160]
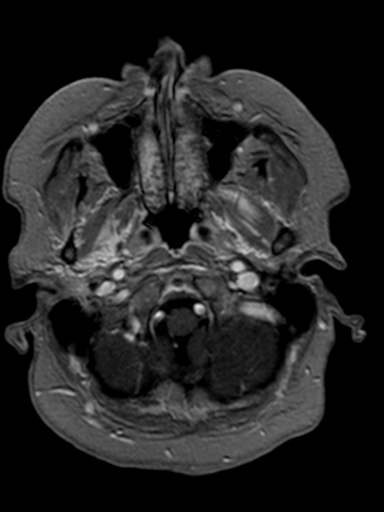
[im 54/160]
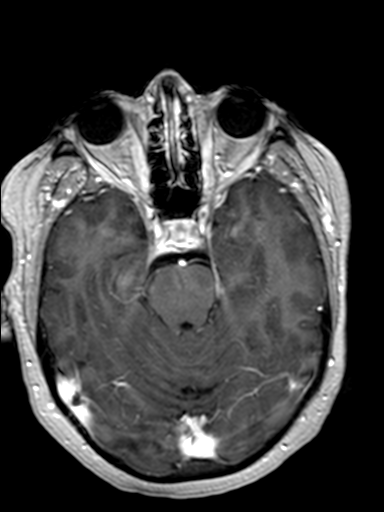
[im 71/160]
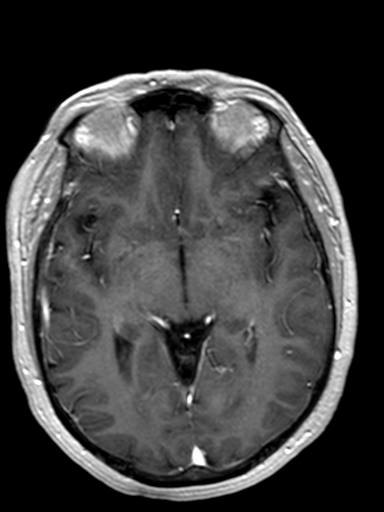
[im 89/160]
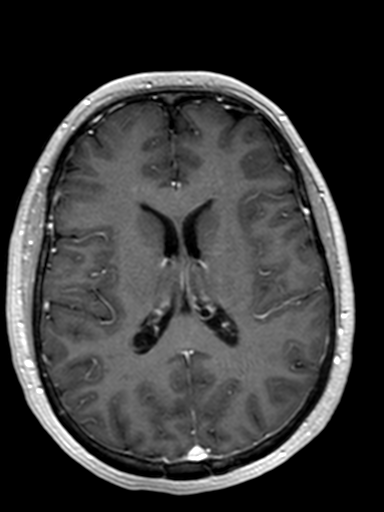
[im 107/160]
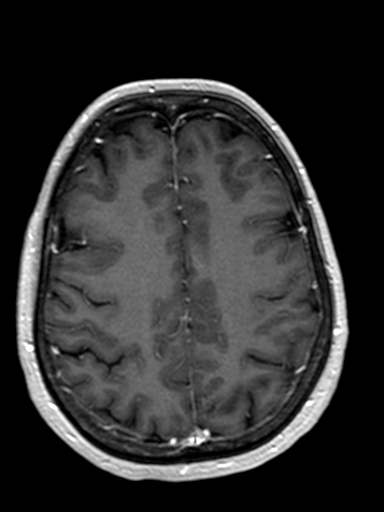
[im 142/160]
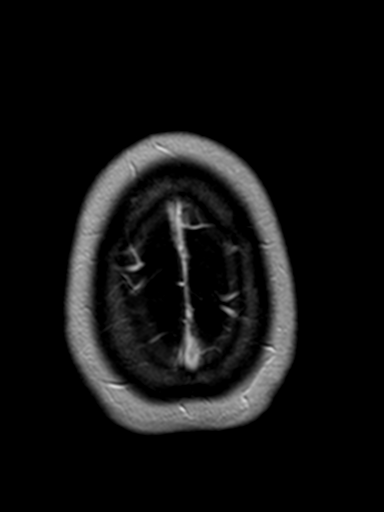
[im 160/160]
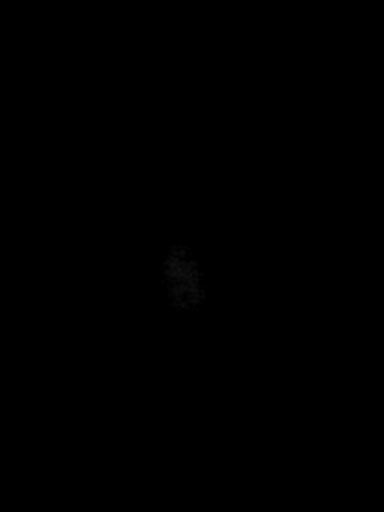

[Series 14: post cor · coronal · 5.0mm · 0.45mm/px · 1 of 29 slices shown]
[im 1/29]
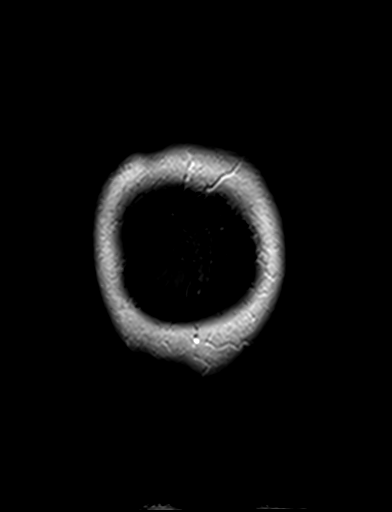

[43 of 48 positions shown; findings below may reference images not displayed]

FINDINGS: Brain: The brain has a normal appearance without evidence of
malformation, atrophy, old or acute small or large vessel
infarction, mass lesion, hemorrhage, hydrocephalus or extra-axial
collection. Single punctate focus of T2 and FLAIR signal in the
right parietal white matter, not likely significant as an isolated
finding.

Vascular: Major vessels at the base of the brain show flow. Venous
sinuses appear patent.

Skull and upper cervical spine: Normal.

Sinuses/Orbits: Clear/normal.

Other: None significant.
IMPRESSION: No acute or subacute finding. Normal study, with exception of a
single punctate focus of T2 and FLAIR signal in the right parietal
white matter. This was not seen in [id number]. As a small isolated
finding, it is not likely of clinical relevance.
